# Patient Record
Sex: Male | Born: 1985 | Race: White | Hispanic: No | Marital: Married | Smoking: Former smoker
Health system: Southern US, Community
[De-identification: ages and names within clinical notes are randomized; demographics above are authoritative.]

## PROBLEM LIST (undated history)

## (undated) DIAGNOSIS — I456 Pre-excitation syndrome: Secondary | ICD-10-CM

## (undated) DIAGNOSIS — Z720 Tobacco use: Secondary | ICD-10-CM

## (undated) DIAGNOSIS — Z8679 Personal history of other diseases of the circulatory system: Secondary | ICD-10-CM

## (undated) DIAGNOSIS — Z9889 Other specified postprocedural states: Secondary | ICD-10-CM

## (undated) DIAGNOSIS — G4733 Obstructive sleep apnea (adult) (pediatric): Secondary | ICD-10-CM

## (undated) HISTORY — DX: Tobacco use: Z72.0

## (undated) HISTORY — DX: Obstructive sleep apnea (adult) (pediatric): G47.33

## (undated) HISTORY — DX: Pre-excitation syndrome: I45.6

---

## 2016-07-18 ENCOUNTER — Encounter (HOSPITAL_COMMUNITY): Payer: Self-pay | Admitting: Emergency Medicine

## 2016-07-18 ENCOUNTER — Emergency Department (HOSPITAL_COMMUNITY)
Admission: EM | Admit: 2016-07-18 | Discharge: 2016-07-18 | Disposition: A | Discharge status: Home / Self Care | Payer: 59 | Attending: Emergency Medicine | Admitting: Emergency Medicine

## 2016-07-18 DIAGNOSIS — K029 Dental caries, unspecified: Secondary | ICD-10-CM | POA: Insufficient documentation

## 2016-07-18 DIAGNOSIS — F17210 Nicotine dependence, cigarettes, uncomplicated: Secondary | ICD-10-CM | POA: Insufficient documentation

## 2016-07-18 DIAGNOSIS — Z79899 Other long term (current) drug therapy: Secondary | ICD-10-CM | POA: Insufficient documentation

## 2016-07-18 DIAGNOSIS — K0889 Other specified disorders of teeth and supporting structures: Secondary | ICD-10-CM | POA: Diagnosis present

## 2016-07-18 MED ORDER — PENICILLIN V POTASSIUM 500 MG PO TABS: 500.0000 mg | ORAL_TABLET | Freq: Three times a day (TID) | ORAL | 0 refills | Status: AC

## 2016-07-18 MED ORDER — IBUPROFEN 400 MG PO TABS: 400.0000 mg | ORAL_TABLET | Freq: Three times a day (TID) | ORAL | 0 refills | Status: DC | PRN

## 2016-07-18 NOTE — ED Provider Notes (Signed)
[facility]-EMERGENCY DEPT Provider Note   CSN: [id number] Arrival date & time: 07/18/16  0524     History   Chief Complaint Chief Complaint  Patient presents with  . Dental Pain    HPI Spencer FowlerJimmy Dean Dalton is a 31 y.o. male.  HPI Patient presents emergency department complaining of one week of left upper dental pain without facial swelling.  No fevers or chills.  No difficulty breathing or swallowing.  No other complaints at this time.  He is scheduled to see dentistry next week.  He is requesting a prescription for antibiotics is concerned he has infection.  Symptoms are mild to moderate in severity   History reviewed. No pertinent past medical history.  There are no active problems to display for this patient.   History reviewed. No pertinent surgical history.     Home Medications    Prior to Admission medications   Medication Sig Start Date End Date Taking? Authorizing Provider  ibuprofen (ADVIL,MOTRIN) 400 MG tablet Take 1 tablet (400 mg total) by mouth every 8 (eight) hours as needed. 07/18/16   Azalia Bilisampos, Meisha Salone, MD  penicillin v potassium (VEETID) 500 MG tablet Take 1 tablet (500 mg total) by mouth 3 (three) times daily. 07/18/16 07/25/16  Azalia Bilisampos, Willye Javier, MD    Family History History reviewed. No pertinent family history.  Social History Social History  Substance Use Topics  . Smoking status: Current Every Day Smoker    Types: Cigarettes  . Smokeless tobacco: Never Used  . Alcohol use No     Allergies   Patient has no known allergies.   Review of Systems Review of Systems  All other systems reviewed and are negative.    Physical Exam Updated Vital Signs BP (!) 141/85 (BP Location: Left Arm)   Pulse 72   Temp 98.5 F (36.9 C) (Oral)   Resp 18   SpO2 100%   Physical Exam  Constitutional: He is oriented to person, place, and time. He appears well-developed and well-nourished.  HENT:  Head: Normocephalic.  Tooth #11 with tenderness.  No gingival swelling  or fluctuance.  Tolerating secretions.  Oral airway patent.  Eyes: EOM are normal.  Neck: Normal range of motion.  Pulmonary/Chest: Effort normal.  Abdominal: He exhibits no distension.  Musculoskeletal: Normal range of motion.  Neurological: He is alert and oriented to person, place, and time.  Psychiatric: He has a normal mood and affect.  Nursing note and vitals reviewed.    ED Treatments / Results  Labs (all labs ordered are listed, but only abnormal results are displayed) Labs Reviewed - No data to display  EKG  EKG Interpretation None       Radiology No results found.  Procedures Procedures (including critical care time)  Medications Ordered in ED Medications - No data to display   Initial Impression / Assessment and Plan / ED Course  I have reviewed the triage vital signs and the nursing notes.  Pertinent labs & imaging results that were available during my care of the patient were reviewed by me and considered in my medical decision making (see chart for details).     Dental Pain. Home with antibiotics and pain medicine. Recommend dental follow up. No signs of gingival abscess. Tolerating secretions. Airway patent. No sub lingular swelling   Final Clinical Impressions(s) / ED Diagnoses   Final diagnoses:  Pain due to dental caries    New Prescriptions New Prescriptions   IBUPROFEN (ADVIL,MOTRIN) 400 MG TABLET    Take 1  tablet (400 mg total) by mouth every 8 (eight) hours as needed.   PENICILLIN V POTASSIUM (VEETID) 500 MG TABLET    Take 1 tablet (500 mg total) by mouth 3 (three) times daily.     Azalia Bilis, MD 07/18/16 [phone]

## 2016-07-18 NOTE — Discharge Instructions (Signed)
Please follow up with the dentist as scheduled.

## 2016-07-18 NOTE — ED Notes (Signed)
[facility] Expected date:  Expected time:  Means of arrival:  Comments: EMS/Back pain

## 2016-07-18 NOTE — ED Triage Notes (Signed)
Pt states he has an abscessed tooth and needs antibiotics  Pt states he has a dentist appt on Monday

## 2016-07-18 NOTE — ED Notes (Signed)
Called out pt's name and pt's friend said he is outside waiting for a bus pass

## 2016-07-18 NOTE — ED Notes (Signed)
[facility]: [facility] Expected date:  Expected time:  Means of arrival:  Comments: 

## 2016-07-18 NOTE — ED Notes (Signed)
Dental pain. 

## 2017-04-05 ENCOUNTER — Encounter: Payer: Self-pay | Admitting: Pulmonary Disease

## 2017-04-05 ENCOUNTER — Ambulatory Visit (INDEPENDENT_AMBULATORY_CARE_PROVIDER_SITE_OTHER): Payer: 59 | Admitting: Pulmonary Disease

## 2017-04-05 ENCOUNTER — Ambulatory Visit (INDEPENDENT_AMBULATORY_CARE_PROVIDER_SITE_OTHER)
Admission: RE | Admit: 2017-04-05 | Discharge: 2017-04-05 | Disposition: A | Discharge status: Home / Self Care | Payer: 59 | Source: Ambulatory Visit | Attending: Pulmonary Disease | Admitting: Pulmonary Disease

## 2017-04-05 ENCOUNTER — Ambulatory Visit: Payer: 59 | Admitting: Pulmonary Disease

## 2017-04-05 VITALS — BP 140/82 | HR 86 | Ht 77.0 in | Wt 282.0 lb

## 2017-04-05 DIAGNOSIS — I456 Pre-excitation syndrome: Secondary | ICD-10-CM

## 2017-04-05 DIAGNOSIS — R0602 Shortness of breath: Secondary | ICD-10-CM | POA: Diagnosis not present

## 2017-04-05 DIAGNOSIS — G4733 Obstructive sleep apnea (adult) (pediatric): Secondary | ICD-10-CM

## 2017-04-05 DIAGNOSIS — Z720 Tobacco use: Secondary | ICD-10-CM | POA: Diagnosis not present

## 2017-04-05 HISTORY — DX: Pre-excitation syndrome: I45.6

## 2017-04-05 HISTORY — DX: Tobacco use: Z72.0

## 2017-04-05 HISTORY — DX: Obstructive sleep apnea (adult) (pediatric): G47.33

## 2017-04-05 LAB — PULMONARY FUNCTION TEST
FEF 25-75 Post: 4 L/sec
FEF 25-75 Pre: 2.18 L/sec
FEF2575-%CHANGE-POST: 83 %
FEF2575-%Pred-Post: 85 %
FEF2575-%Pred-Pre: 46 %
FEV1-%Change-Post: 27 %
FEV1-%Pred-Post: 94 %
FEV1-%Pred-Pre: 73 %
FEV1-Post: 4.63 L
FEV1-Pre: 3.62 L
FEV1FVC-%CHANGE-POST: 17 %
FEV1FVC-%Pred-Pre: 75 %
FEV6-%CHANGE-POST: 10 %
FEV6-%PRED-PRE: 96 %
FEV6-%Pred-Post: 106 %
FEV6-PRE: 5.79 L
FEV6-Post: 6.38 L
FEV6FVC-%Change-Post: 1 %
FEV6FVC-%PRED-PRE: 100 %
FEV6FVC-%Pred-Post: 101 %
FVC-%CHANGE-POST: 9 %
FVC-%Pred-Post: 105 %
FVC-%Pred-Pre: 96 %
FVC-POST: 6.43 L
FVC-Pre: 5.9 L
POST FEV6/FVC RATIO: 99 %
Post FEV1/FVC ratio: 72 %
Pre FEV1/FVC ratio: 61 %
Pre FEV6/FVC Ratio: 98 %

## 2017-04-05 NOTE — Progress Notes (Signed)
Spirometry pre and post done today. 

## 2017-04-05 NOTE — Patient Instructions (Signed)
Home sleep study will be scheduled. We discussed treatment options.  Chest x-ray today. Congratulations on quitting smoking. He can use nicotine patch 21 mg/day or nicotine gum as needed. 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

## 2017-04-05 NOTE — Assessment & Plan Note (Signed)
Chest x-ray today. Congratulations on quitting smoking. He can use nicotine patch 21 mg/day or nicotine gum as needed. -call us back if you need anything else for smoking urges

## 2017-04-05 NOTE — Assessment & Plan Note (Signed)
Given excessive daytime somnolence, narrow pharyngeal exam, witnessed apneas & loud snoring, obstructive sleep apnea is very likely & an overnight polysomnogram will be scheduled as a home study. The pathophysiology of obstructive sleep apnea , it's cardiovascular consequences & modes of treatment including CPAP were discused with the patient in detail & they evidenced understanding.  Pretest probability is high 

## 2017-04-05 NOTE — Assessment & Plan Note (Signed)
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

## 2017-04-05 NOTE — Progress Notes (Signed)
Subjective:    Patient ID: Spencer Dalton, male    DOB: [birth year], 32 y.o.   MRN: [id number]  HPI  32 year old presents for evaluation of sleep disordered breathing and dyspnea. He is accompanied by his wife Marchelle Folks who provides and corroborates sleep history. He reports choking episodes that awoken him up from his sleep.  Wife reports loud snoring and witnessed apneas.  He reports excessive daytime somnolence and fatigue, he is only 32 but feels like an old person. Epworth sleepiness score is 7.  He works the swing shift and has to work nights every fifth week. Bedtime can be around 10 PM, sleep latency is minimal, he has no trouble ever falling asleep, he sleeps on his left side with one pillow because his left nostril is blocked, reports 4-5 nocturnal awakenings including nocturia.  Occasionally he wakes up choking and has to sit up on the side of his bed, wakeup time varies depending on his workday, on weekends he can sleep straight up to 10 AM and sometimes even up to noon. He has lost 30 pounds from his initial weight of 310 pounds to his current weight of 290 There is no history suggestive of cataplexy, sleep paralysis or parasomnias   About 2 years ago around Thanksgiving, he had an ED visit for tachycardia and was diagnosed with WPW syndrome.  He has never seen cardiology for this.  He does not have a PCP.  He smoked about a pack per day until he quit about 2 months ago-about 15 pack years.  He still feels short of breath while on the treadmill and feels like he has a burning sensation in his chest.  He also feels dyspneic while climbing up the tanker.  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 Family history-cousin died at age 28 of heart disease, grandmother had emphysema, was on oxygen and died at age 72, uncle had lung cancer at age 47 and was a never smoker   No past medical history on file.  No past surgical history on file.  No Known  Allergies  Social History   Socioeconomic History  . Marital status: Married    Spouse name: Not on file  . Number of children: Not on file  . Years of education: Not on file  . Highest education level: Not on file  Social Needs  . Financial resource strain: Not on file  . Food insecurity - worry: Not on file  . Food insecurity - inability: Not on file  . Transportation needs - medical: Not on file  . Transportation needs - non-medical: Not on file  Occupational History  . Not on file  Tobacco Use  . Smoking status: Former Smoker    Packs/day: 1.00    Types: Cigarettes    Last attempt to quit: 01/31/2017    Years since quitting: 0.1  . Smokeless tobacco: Never Used  Substance and Sexual Activity  . Alcohol use: No  . Drug use: No  . Sexual activity: Not on file  Other Topics Concern  . Not on file  Social History Narrative  . Not on file      Review of Systems  HENT: Positive for congestion, nosebleeds, postnasal drip, sinus pressure, sinus pain and sore throat. Negative for sneezing.   Respiratory: Positive for apnea, cough, chest tightness, shortness of breath, wheezing and stridor. Negative for choking.   Neurological: Positive for dizziness, weakness, light-headedness and numbness. Negative  for seizures, syncope, speech difficulty and headaches.       Objective:   Physical Exam  Gen. Pleasant, tall obese, in no distress, normal affect ENT - underbite,, no post nasal drip, class 2 airway Neck: No JVD, no thyromegaly, no carotid bruits Lungs: no use of accessory muscles, no dullness to percussion, decreased without rales or rhonchi  Cardiovascular: Rhythm regular, heart sounds  normal, no murmurs or gallops, no peripheral edema Abdomen: soft and non-tender, no hepatosplenomegaly, BS normal. Musculoskeletal: No deformities, no cyanosis or clubbing Neuro:  alert, non focal, no tremors       Assessment & Plan:

## 2017-04-07 ENCOUNTER — Other Ambulatory Visit: Payer: Self-pay

## 2017-04-07 DIAGNOSIS — I456 Pre-excitation syndrome: Secondary | ICD-10-CM

## 2017-04-07 MED ORDER — ALBUTEROL SULFATE HFA 108 (90 BASE) MCG/ACT IN AERS: 2.0000 | INHALATION_SPRAY | Freq: Four times a day (QID) | RESPIRATORY_TRACT | 6 refills | Status: AC | PRN

## 2017-04-14 ENCOUNTER — Emergency Department (HOSPITAL_COMMUNITY): Payer: 59

## 2017-04-14 ENCOUNTER — Other Ambulatory Visit: Payer: Self-pay

## 2017-04-14 ENCOUNTER — Encounter (HOSPITAL_COMMUNITY): Payer: Self-pay | Admitting: Emergency Medicine

## 2017-04-14 DIAGNOSIS — Z5321 Procedure and treatment not carried out due to patient leaving prior to being seen by health care provider: Secondary | ICD-10-CM | POA: Diagnosis not present

## 2017-04-14 DIAGNOSIS — R079 Chest pain, unspecified: Secondary | ICD-10-CM | POA: Insufficient documentation

## 2017-04-14 NOTE — ED Triage Notes (Signed)
Patient is having left chest pain. Patient stated he has had it for a week. Patient complaining of left arm pain while at the gym. He has also had left jaw pain.

## 2017-04-14 NOTE — ED Notes (Signed)
No answer for blood draw @ this time. 

## 2017-04-15 ENCOUNTER — Emergency Department (HOSPITAL_COMMUNITY)
Admission: EM | Admit: 2017-04-15 | Discharge: 2017-04-15 | Disposition: A | Discharge status: Home / Self Care | Payer: 59 | Attending: Emergency Medicine | Admitting: Emergency Medicine

## 2017-04-15 NOTE — ED Notes (Signed)
Called patient to be taken to a bed and no answer.

## 2017-05-13 DIAGNOSIS — G4710 Hypersomnia, unspecified: Secondary | ICD-10-CM | POA: Diagnosis not present

## 2017-05-15 ENCOUNTER — Other Ambulatory Visit: Payer: Self-pay | Admitting: *Deleted

## 2017-05-15 DIAGNOSIS — G4733 Obstructive sleep apnea (adult) (pediatric): Secondary | ICD-10-CM

## 2017-05-15 DIAGNOSIS — G4710 Hypersomnia, unspecified: Secondary | ICD-10-CM | POA: Diagnosis not present

## 2017-05-23 ENCOUNTER — Telehealth: Payer: Self-pay | Admitting: Pulmonary Disease

## 2017-05-23 DIAGNOSIS — G4719 Other hypersomnia: Secondary | ICD-10-CM

## 2017-05-23 DIAGNOSIS — G4733 Obstructive sleep apnea (adult) (pediatric): Secondary | ICD-10-CM

## 2017-05-23 NOTE — Telephone Encounter (Signed)
Either repeat home sleep study or split-night study

## 2017-05-23 NOTE — Telephone Encounter (Signed)
Per RA, surprisingly did not show OSA. Did he sleep well? Consider repeating or doing a split study as the next step.

## 2017-05-23 NOTE — Telephone Encounter (Signed)
Spoke with patient. He stated that he did sleep well during the test. Advised patient that I would relay this message to RA. Patient stated that he does not feel tired all of the time, it just happens on random nights.   RA, please advise if you still want him to split night? Thanks!

## 2017-05-24 NOTE — Telephone Encounter (Signed)
Pt is aware of below message and voiced his understanding. Pt wishes to repeat HST. Order for HST has been ordered. Nothing further is needed.

## 2018-02-13 ENCOUNTER — Encounter: Payer: Self-pay | Admitting: Internal Medicine

## 2018-02-13 ENCOUNTER — Ambulatory Visit: Payer: 59 | Admitting: Internal Medicine

## 2018-02-13 VITALS — BP 136/82 | HR 90 | Ht 77.0 in | Wt 311.0 lb

## 2018-02-13 DIAGNOSIS — R0789 Other chest pain: Secondary | ICD-10-CM | POA: Insufficient documentation

## 2018-02-13 DIAGNOSIS — I456 Pre-excitation syndrome: Secondary | ICD-10-CM

## 2018-02-13 MED ORDER — METOPROLOL TARTRATE 25 MG PO TABS: 12.5000 mg | ORAL_TABLET | Freq: Two times a day (BID) | ORAL | 3 refills | Status: DC | PRN

## 2018-02-13 NOTE — Patient Instructions (Signed)
Medication Instructions:  Dr. Rennis Golden has prescribed metoprolol tartrate 12.5mg  to use twice daily AS NEEDED for tachycardia (fast heart rhythm) If you need a refill on your cardiac medications before your next appointment, please call your pharmacy.   Testing/Procedures: Your physician has requested that you have an exercise tolerance test. This is done @ Dr. Blanchie Dessert office - every day except Monday  Follow-Up: At [facility], you and your health needs are our priority.  As part of our continuing mission to provide you with exceptional heart care, we have created designated Provider Care Teams.  These Care Teams include your primary Cardiologist (physician) and Advanced Practice Providers (APPs -  Physician Assistants and Nurse Practitioners) who all work together to provide you with the care you need, when you need it. You will need a follow up appointment in 3 months. You may see Dr. Rennis Golden or one of the following Advanced Practice Providers on your designated Care Team: Azalee Course, [place] . Micah Flesher, PA-C  Any Other Special Instructions Will Be Listed Below (If Applicable).  You have been referred to Dr. Loman Brooklyn (cardiac electrophysiologist) @ [address]rd [address]

## 2018-02-13 NOTE — Progress Notes (Signed)
OFFICE NOTE  Chief Complaint:  Establish cardiologist  Primary Care Physician: Patient, No Pcp Per  HPI:  Spencer Dalton is a 33 y.o. male with a past medial history significant for palpitations and atypical chest discomfort, recently seen in the ER and diagnosed with WPW.  He also has a history obstructive sleep apnea, moderate obesity and tobacco abuse.  Family history significant for hypertension in his father and some remote heart disease in his grandparents.  He presents today for evaluation of symptomatic palpitations and chest discomfort.  He describes intermittent left-sided chest discomfort which can be present at rest or associated with exertion.  It is described as a squeezing and oftentimes radiates down the left arm.  Intensity is somewhat diminished at about 2-3 out of 10.  Sometimes he feels like it is associated with palpitations or heart racing.  His EKG was personally reviewed and shows sinus rhythm with sinus arrhythmia at 90 and clear delta waves suggestive of WPW.  He is not on treatment for this.  He denies any history of A. fib or other known arrhythmias.  When he was seen in the ER they did not capture any tachyarrhythmias.  PMHx:  Past Medical History:  Diagnosis Date  . OSA (obstructive sleep apnea) 04/05/2017  . Tobacco abuse 04/05/2017  . WPW (Wolff-Parkinson-White syndrome) 04/05/2017    History reviewed. No pertinent surgical history.  FAMHx:  Family History  Problem Relation Age of Onset  . Diabetes Mother   . Hypertension Father   . Heart attack Maternal Grandmother   . Stroke Maternal Grandfather     SOCHx:   reports that he quit smoking about a year ago. His smoking use included cigarettes. He smoked 1.00 pack per day. He has never used smokeless tobacco. He reports that he does not drink alcohol or use drugs.  ALLERGIES:  No Known Allergies  ROS: Pertinent items noted in HPI and remainder of comprehensive ROS otherwise negative.  HOME  MEDS: Current Outpatient Medications on File Prior to Visit  Medication Sig Dispense Refill  . albuterol (PROVENTIL HFA;VENTOLIN HFA) 108 (90 Base) MCG/ACT inhaler Inhale 2 puffs into the lungs every 6 (six) hours as needed for wheezing or shortness of breath. 1 Inhaler 6  . ibuprofen (ADVIL,MOTRIN) 400 MG tablet Take 1 tablet (400 mg total) by mouth every 8 (eight) hours as needed. 15 tablet 0   No current facility-administered medications on file prior to visit.     LABS/IMAGING: No results found for this or any previous visit (from the past 48 hour(s)). No results found.  LIPID PANEL: No results found for: CHOL, TRIG, HDL, CHOLHDL, VLDL, LDLCALC, LDLDIRECT   WEIGHTS: Wt Readings from Last 3 Encounters:  02/13/18 (!) 311 lb (141.1 kg)  04/14/17 282 lb (127.9 kg)  04/05/17 282 lb (127.9 kg)    VITALS: BP 136/82 (BP Location: Left Arm, Patient Position: Sitting, Cuff Size: Large)   Pulse 90   Ht 6\' 5"  (1.956 m)   Wt (!) 311 lb (141.1 kg)   BMI 36.88 kg/m   EXAM: General appearance: alert and no distress Neck: no carotid bruit, no JVD and thyroid not enlarged, symmetric, no tenderness/mass/nodules Lungs: clear to auscultation bilaterally Heart: regular rate and rhythm, S1, S2 normal, no murmur, click, rub or gallop Abdomen: soft, non-tender; bowel sounds normal; no masses,  no organomegaly Extremities: extremities normal, atraumatic, no cyanosis or edema Pulses: 2+ and symmetric Skin: Skin color, texture, turgor normal. No rashes or lesions Neurologic: Grossly  normal Psych: Pleasant  EKG: Sinus rhythm with sinus arrhythmia, WPW pattern at 90- personally reviewed  ASSESSMENT: 1. WPW 2. Left-sided chest discomfort 3. Symptomatic palpitations  PLAN: 1.   Spencer Dalton is describing symptomatic palpitations and left-sided chest discomfort which is atypical for angina, nevertheless I would like him to undergo plain old exercise treadmill stress testing.  Additionally, with  probable symptomatic palpitations, we discussed options for management of WPW including prophylactic beta-blocker, antiarrhythmic therapy or perhaps a referral for catheter ablation.  Given his young age and desire not to take medications and the fact that he could potentially have significant or life-threatening arrhythmias if his WPW were mismanaged, I would recommend referral for evaluation of catheter ablation.  He is in agreement with this.  We will provide a low-dose beta-blocker to use as needed for recurrent palpitations.  Plan follow-up with me in 3 to 6 months.  Chrystie Nose C. , MD, [facility] Healthcare SystemFACC, FACP  [place]  [facility]  Medical Director of the [facility]id [facility]  Cardiovascular Risk Reduction [facility] Diplomate of the American Board of Clinical Lipidology Attending Cardiologist  Direct Dial: [phone]  Fax: [phone]  Website:  [email or web address]    C  02/13/2018, 5:42 PM

## 2018-02-15 ENCOUNTER — Telehealth (HOSPITAL_COMMUNITY): Payer: Self-pay

## 2018-02-15 NOTE — Telephone Encounter (Signed)
Encounter complete. 

## 2018-02-16 ENCOUNTER — Telehealth (HOSPITAL_COMMUNITY): Payer: Self-pay

## 2018-02-16 NOTE — Telephone Encounter (Signed)
Encounter complete. 

## 2018-02-20 ENCOUNTER — Ambulatory Visit (HOSPITAL_COMMUNITY)
Admission: RE | Admit: 2018-02-20 | Payer: 59 | Source: Ambulatory Visit | Attending: Internal Medicine | Admitting: Internal Medicine

## 2018-02-23 ENCOUNTER — Encounter: Payer: Self-pay | Admitting: Cardiology

## 2018-02-23 ENCOUNTER — Ambulatory Visit: Payer: 59 | Admitting: Cardiology

## 2018-02-23 VITALS — BP 134/72 | HR 85 | Ht 77.0 in | Wt 307.0 lb

## 2018-02-23 DIAGNOSIS — I456 Pre-excitation syndrome: Secondary | ICD-10-CM | POA: Diagnosis not present

## 2018-02-23 NOTE — Patient Instructions (Addendum)
Medication Instructions:  Your physician recommends that you continue on your current medications as directed. Please refer to the Current Medication list given to you today.  * If you need a refill on your cardiac medications before your next appointment, please call your pharmacy.   Labwork: None ordered  Testing/Procedures: Your physician has requested that you have an exercise tolerance test - before your ablation on 03/30/18. For further information please visit [email or web address] Please also follow instruction sheet, as given.  Your physician has recommended that you have an ablation. Catheter ablation is a medical procedure used to treat some cardiac arrhythmias (irregular heartbeats). During catheter ablation, a long, thin, flexible tube is put into a blood vessel in your groin (upper thigh), or neck. This tube is called an ablation catheter. It is then guided to your heart through the blood vessel. Radio frequency waves destroy small areas of heart tissue where abnormal heartbeats may cause an arrhythmia to start. Please see the instruction sheet given to you today.  Instructions for your ablation: 1. [facility] at 7:30 am on 03/30/2018. 2. Do not eat or drink after midnight the night prior to the procedure. 4. Do not take any medications the morning of the procedure. 5. Both of your groins will need to be shaved for this procedure (if needed). We ask that you do this yourself at home 1-2 days prior to the procedure.  If you are unable/uncomfortable to do yourself, the hospital staff will shave you the day of your procedure (if needed). 6. Plan for an overnight stay in the hospital. 7. You will need someone to drive you home at discharge.   Follow-Up: Your physician recommends that you schedule a follow-up appointment in: 4 weeks, after your procedure on 03/30/18, with Dr. Elberta Fortisamnitz.  *Please note that any paperwork  needing to be filled out by the provider will need to be addressed at the front desk prior to seeing the provider. Please note that any FMLA, disability or other documents regarding health condition is subject to a $25.00 charge that must be received prior to completion of paperwork in the form of a money order or check.  Thank you for choosing [facility]!!   Dory HornSherri Lashon Hillier, RN [phone]  Any Other Special Instructions Will Be Listed Below (If Applicable).  CPT code for ablation -- [phone]   Cardiac Ablation Cardiac ablation is a procedure to disable (ablate) a small amount of heart tissue in very specific places. The heart has many electrical connections. Sometimes these connections are abnormal and can cause the heart to beat very fast or irregularly. Ablating some of the problem areas can improve the heart rhythm or return it to normal. Ablation may be done for people who:  Have Wolff-Parkinson-White syndrome.  Have fast heart rhythms (tachycardia).  Have taken medicines for an abnormal heart rhythm (arrhythmia) that were not effective or caused side effects.  Have a high-risk heartbeat that may be life-threatening. During the procedure, a small incision is made in the neck or the groin, and a long, thin, flexible tube (catheter) is inserted into the incision and moved to the heart. Small devices (electrodes) on the tip of the catheter will send out electrical currents. A type of X-ray (fluoroscopy) will be used to help guide the catheter and to provide images of the heart. Tell a health care provider about:  Any allergies you have.  All medicines you are taking, including vitamins,  herbs, eye drops, creams, and over-the-counter medicines.  Any problems you or family members have had with anesthetic medicines.  Any blood disorders you have.  Any surgeries you have had.  Any medical conditions you have, such as kidney failure.  Whether you are pregnant or may be  pregnant. What are the risks? Generally, this is a safe procedure. However, problems may occur, including:  Infection.  Bruising and bleeding at the catheter insertion site.  Bleeding into the chest, especially into the sac that surrounds the heart. This is a serious complication.  Stroke or blood clots.  Damage to other structures or organs.  Allergic reaction to medicines or dyes.  Need for a permanent pacemaker if the normal electrical system is damaged. A pacemaker is a small computer that sends electrical signals to the heart and helps your heart beat normally.  The procedure not being fully effective. This may not be recognized until months later. Repeat ablation procedures are sometimes required. What happens before the procedure?  Follow instructions from your health care provider about eating or drinking restrictions.  Ask your health care provider about: ? Changing or stopping your regular medicines. This is especially important if you are taking diabetes medicines or blood thinners. ? Taking medicines such as aspirin and ibuprofen. These medicines can thin your blood. Do not take these medicines before your procedure if your health care provider instructs you not to.  Plan to have someone take you home from the hospital or clinic.  If you will be going home right after the procedure, plan to have someone with you for 24 hours. What happens during the procedure?  To lower your risk of infection: ? Your health care team will wash or sanitize their hands. ? Your skin will be washed with soap. ? Hair may be removed from the incision area.  An IV tube will be inserted into one of your veins.  You will be given a medicine to help you relax (sedative).  The skin on your neck or groin will be numbed.  An incision will be made in your neck or your groin.  A needle will be inserted through the incision and into a large vein in your neck or groin.  A catheter will be  inserted into the needle and moved to your heart.  Dye may be injected through the catheter to help your surgeon see the area of the heart that needs treatment.  Electrical currents will be sent from the catheter to ablate heart tissue in desired areas. There are three types of energy that may be used to ablate heart tissue: ? Heat (radiofrequency energy). ? Laser energy. ? Extreme cold (cryoablation).  When the necessary tissue has been ablated, the catheter will be removed.  Pressure will be held on the catheter insertion area to prevent excessive bleeding.  A bandage (dressing) will be placed over the catheter insertion area. The procedure may vary among health care providers and hospitals. What happens after the procedure?  Your blood pressure, heart rate, breathing rate, and blood oxygen level will be monitored until the medicines you were given have worn off.  Your catheter insertion area will be monitored for bleeding. You will need to lie still for a few hours to ensure that you do not bleed from the catheter insertion area.  Do not drive for 24 hours or as long as directed by your health care provider. Summary  Cardiac ablation is a procedure to disable (ablate) a small amount of  heart tissue in very specific places. Ablating some of the problem areas can improve the heart rhythm or return it to normal.  During the procedure, electrical currents will be sent from the catheter to ablate heart tissue in desired areas. This information is not intended to replace advice given to you by your health care provider. Make sure you discuss any questions you have with your health care provider. Document Released: 06/05/2008 Document Revised: 12/07/2015 Document Reviewed: 12/07/2015 Elsevier Interactive Patient Education  2019 [employer]

## 2018-02-23 NOTE — Progress Notes (Signed)
Electrophysiology Office Note   Date:  02/23/2018   ID:  Spencer Dalton, Spencer Dalton 08/23/1985, MRN [id number]  PCP:  Patient, No Pcp Per  Cardiologist:  Hilty Primary Electrophysiologist:  Lasonia Casino Jorja Loa, MD    No chief complaint on file.    History of Present Illness: Spencer Dalton is a 33 y.o. male who is being seen today for the evaluation of WPW at the request of Hilty, Lisette Abu, MD. Presenting today for electrophysiology evaluation.  He has a history of palpitations and atypical chest pain.  He was recently seen in the emergency room and diagnosed with WPW.  He also has obstructive sleep apnea, moderate obesity, and tobacco abuse.  He has been having intermittent left-sided chest discomfort associated with rest and with exertion.  He also feels that he has palpitations.  His EKG shows sinus rhythm with delta waves suggestive of WPW.  He has not been on any therapy for this.  He denies any other history of atrial fibrillation or other known arrhythmias.    Today, he denies symptoms of shortness of breath, orthopnea, PND, lower extremity edema, claudication, dizziness, presyncope, syncope, bleeding, or neurologic sequela. The patient is tolerating medications without difficulties.  He was put on metoprolol as needed for his palpitations.  He has continued to have episodic palpitations despite his metoprolol use.  He also continues to have chest pain, though with further questioning, he feels that it may be due to musculoskeletal causes.   Past Medical History:  Diagnosis Date  . OSA (obstructive sleep apnea) 04/05/2017  . Tobacco abuse 04/05/2017  . WPW (Wolff-Parkinson-White syndrome) 04/05/2017   History reviewed. No pertinent surgical history.   Current Outpatient Medications  Medication Sig Dispense Refill  . albuterol (PROVENTIL HFA;VENTOLIN HFA) 108 (90 Base) MCG/ACT inhaler Inhale 2 puffs into the lungs every 6 (six) hours as needed for wheezing or shortness of breath. 1  Inhaler 6  . ibuprofen (ADVIL,MOTRIN) 400 MG tablet Take 1 tablet (400 mg total) by mouth every 8 (eight) hours as needed. 15 tablet 0  . metoprolol tartrate (LOPRESSOR) 25 MG tablet Take 0.5 tablets (12.5 mg total) by mouth 2 (two) times daily as needed (tachycardia). 60 tablet 3   No current facility-administered medications for this visit.     Allergies:   Patient has no known allergies.   Social History:  The patient  reports that he quit smoking about 12 months ago. His smoking use included cigarettes. He smoked 1.00 pack per day. He has never used smokeless tobacco. He reports that he does not drink alcohol or use drugs.   Family History:  The patient's family history includes Diabetes in his mother; Heart attack in his maternal grandmother; Hypertension in his father; Stroke in his maternal grandfather.    ROS:  Please see the history of present illness.   Otherwise, review of systems is positive for fatigue, chest pain, shortness of breath, palpitations, anxiety.   All other systems are reviewed and negative.    PHYSICAL EXAM: VS:  BP 134/72   Pulse 85   Ht 6\' 5"  (1.956 m)   Wt (!) 307 lb (139.3 kg)   BMI 36.40 kg/m  , BMI Body mass index is 36.4 kg/m. GEN: Well nourished, well developed, in no acute distress  HEENT: normal  Neck: no JVD, carotid bruits, or masses Cardiac: RRR; no murmurs, rubs, or gallops,no edema  Respiratory:  clear to auscultation bilaterally, normal work of breathing GI: soft, nontender, nondistended, +  BS MS: no deformity or atrophy  Skin: warm and dry Neuro:  Strength and sensation are intact Psych: euthymic mood, full affect  EKG:  EKG is ordered today. Personal review of the ekg ordered shows sinus rhythm, rate 85, PACs, ventricular preexcitation  Recent Labs: No results found for requested labs within last 8760 hours.    Lipid Panel  No results found for: CHOL, TRIG, HDL, CHOLHDL, VLDL, LDLCALC, LDLDIRECT   Wt Readings from Last 3  Encounters:  02/23/18 (!) 307 lb (139.3 kg)  02/13/18 (!) 311 lb (141.1 kg)  04/14/17 282 lb (127.9 kg)      Other studies Reviewed: Additional studies/ records that were reviewed today include: Epic notes   ASSESSMENT AND PLAN:  1.  WPW: Currently symptomatic with palpitations.  Is certainly possible that he is having tachycardia related to his accessory pathway.  Due to that, we Cleburn Maiolo plan for ablation.  Risks and benefits were discussed and include bleeding, tamponade, heart block, stroke.  The patient understand these risks and is agreed to the procedure.  2.  Chest pain: Patient is been ordered for an exercise treadmill test.  We Masato Pettie make sure that it is scheduled today.    Current medicines are reviewed at length with the patient today.   The patient does not have concerns regarding his medicines.  The following changes were made today:  none  Labs/ tests ordered today include:  Orders Placed This Encounter  Procedures  . EKG 12-Lead   Case discussed with primary cardiology  Disposition:   FU with Samie Barclift 2 months  Signed, Glorianna Gott Jorja Loa, MD  02/23/2018 11:33 AM     [facility] [address] [place] [address] [phone] (office) [phone] (fax)

## 2018-03-13 ENCOUNTER — Telehealth: Payer: Self-pay | Admitting: *Deleted

## 2018-03-13 DIAGNOSIS — I456 Pre-excitation syndrome: Secondary | ICD-10-CM

## 2018-03-13 DIAGNOSIS — Z01812 Encounter for preprocedural laboratory examination: Secondary | ICD-10-CM

## 2018-03-13 NOTE — Telephone Encounter (Signed)
Informed pt of scheduling change at the hospital. Pt aware to arrive at 10:30 am on 2/28 for his procedure. Patient verbalized understanding and agreeable to plan.

## 2018-03-15 NOTE — Telephone Encounter (Signed)
Pt will have lab work on 2/18 when he comes for GXT.  He also agreed to schedule his 4 week post ablation f/u w/ Camnitz before he leaves office on 2/18.

## 2018-03-15 NOTE — Addendum Note (Signed)
Addended by: Baird Lyons on: 03/15/2018 03:47 PM   Modules accepted: Orders

## 2018-03-20 ENCOUNTER — Ambulatory Visit (INDEPENDENT_AMBULATORY_CARE_PROVIDER_SITE_OTHER): Payer: 59

## 2018-03-20 ENCOUNTER — Other Ambulatory Visit: Payer: 59

## 2018-03-20 DIAGNOSIS — Z01812 Encounter for preprocedural laboratory examination: Secondary | ICD-10-CM

## 2018-03-20 DIAGNOSIS — I456 Pre-excitation syndrome: Secondary | ICD-10-CM

## 2018-03-20 DIAGNOSIS — R0789 Other chest pain: Secondary | ICD-10-CM | POA: Diagnosis not present

## 2018-03-21 LAB — BASIC METABOLIC PANEL
BUN/Creatinine Ratio: 15 (ref 9–20)
BUN: 15 mg/dL (ref 6–20)
CO2: 26 mmol/L (ref 20–29)
Calcium: 9.4 mg/dL (ref 8.7–10.2)
Chloride: 98 mmol/L (ref 96–106)
Creatinine, Ser: 1.02 mg/dL (ref 0.76–1.27)
GFR calc Af Amer: 110 mL/min/{1.73_m2} (ref >59)
GFR calc non Af Amer: 95 mL/min/{1.73_m2} (ref >59)
Glucose: 79 mg/dL (ref 65–99)
Potassium: 5 mmol/L (ref 3.5–5.2)
Sodium: 137 mmol/L (ref 134–144)

## 2018-03-21 LAB — EXERCISE TOLERANCE TEST
CSEPPHR: 166 {beats}/min
Estimated workload: 11.9 METS
Exercise duration (min): 9 min
Exercise duration (sec): 30 s
MPHR: 186 {beats}/min
Percent HR: 89 %
Rest HR: 62 {beats}/min

## 2018-03-21 LAB — CBC
HEMOGLOBIN: 17.2 g/dL (ref 13.0–17.7)
Hematocrit: 51.1 % — ABNORMAL HIGH (ref 37.5–51.0)
MCH: 30.8 pg (ref 26.6–33.0)
MCHC: 33.7 g/dL (ref 31.5–35.7)
MCV: 92 fL (ref 79–97)
Platelets: 194 10*3/uL (ref 150–450)
RBC: 5.58 x10E6/uL (ref 4.14–5.80)
RDW: 12 % (ref 11.6–15.4)
WBC: 6.9 10*3/uL (ref 3.4–10.8)

## 2018-03-30 ENCOUNTER — Ambulatory Visit (HOSPITAL_COMMUNITY)
Admission: RE | Admit: 2018-03-30 | Discharge: 2018-03-31 | Disposition: A | Discharge status: Home / Self Care | Payer: 59 | Attending: Cardiology | Admitting: Cardiology

## 2018-03-30 ENCOUNTER — Other Ambulatory Visit: Payer: Self-pay

## 2018-03-30 ENCOUNTER — Encounter (HOSPITAL_COMMUNITY)
Admission: RE | Disposition: A | Discharge status: Home / Self Care | Payer: Self-pay | Source: Home / Self Care | Attending: Cardiology

## 2018-03-30 DIAGNOSIS — I471 Supraventricular tachycardia: Secondary | ICD-10-CM | POA: Diagnosis not present

## 2018-03-30 DIAGNOSIS — R002 Palpitations: Secondary | ICD-10-CM | POA: Insufficient documentation

## 2018-03-30 DIAGNOSIS — G4733 Obstructive sleep apnea (adult) (pediatric): Secondary | ICD-10-CM | POA: Insufficient documentation

## 2018-03-30 DIAGNOSIS — Z823 Family history of stroke: Secondary | ICD-10-CM | POA: Diagnosis not present

## 2018-03-30 DIAGNOSIS — R0789 Other chest pain: Secondary | ICD-10-CM | POA: Insufficient documentation

## 2018-03-30 DIAGNOSIS — Z6841 Body Mass Index (BMI) 40.0 and over, adult: Secondary | ICD-10-CM | POA: Diagnosis not present

## 2018-03-30 DIAGNOSIS — I456 Pre-excitation syndrome: Secondary | ICD-10-CM | POA: Diagnosis not present

## 2018-03-30 DIAGNOSIS — Z87891 Personal history of nicotine dependence: Secondary | ICD-10-CM | POA: Diagnosis not present

## 2018-03-30 DIAGNOSIS — Z8249 Family history of ischemic heart disease and other diseases of the circulatory system: Secondary | ICD-10-CM | POA: Insufficient documentation

## 2018-03-30 DIAGNOSIS — Z8679 Personal history of other diseases of the circulatory system: Secondary | ICD-10-CM

## 2018-03-30 DIAGNOSIS — Z9889 Other specified postprocedural states: Secondary | ICD-10-CM

## 2018-03-30 HISTORY — PX: SVT ABLATION: EP1225

## 2018-03-30 HISTORY — DX: Personal history of other diseases of the circulatory system: Z86.79

## 2018-03-30 HISTORY — DX: Other specified postprocedural states: Z98.890

## 2018-03-30 LAB — POCT ACTIVATED CLOTTING TIME
ACTIVATED CLOTTING TIME: 180 s
Activated Clotting Time: 224 seconds

## 2018-03-30 SURGERY — SVT ABLATION

## 2018-03-30 MED ORDER — HYDROMORPHONE HCL 1 MG/ML IJ SOLN
INTRAMUSCULAR | Status: DC | PRN
  Administered 2018-03-30 (×2): 0.5 mg via INTRAVENOUS

## 2018-03-30 MED ORDER — METOPROLOL TARTRATE 12.5 MG HALF TABLET: 12.5000 mg | ORAL_TABLET | Freq: Two times a day (BID) | ORAL | Status: DC | PRN

## 2018-03-30 MED ORDER — SODIUM CHLORIDE 0.9% FLUSH
3.0000 mL | Freq: Two times a day (BID) | INTRAVENOUS | Status: DC
  Administered 2018-03-30: 3 mL via INTRAVENOUS

## 2018-03-30 MED ORDER — FENTANYL CITRATE (PF) 100 MCG/2ML IJ SOLN
INTRAMUSCULAR | Status: AC
  Filled 2018-03-30: qty 2

## 2018-03-30 MED ORDER — MIDAZOLAM HCL 5 MG/5ML IJ SOLN
INTRAMUSCULAR | Status: AC
  Filled 2018-03-30: qty 5

## 2018-03-30 MED ORDER — SODIUM CHLORIDE 0.9 % IV SOLN: 250.0000 mL | INTRAVENOUS | Status: DC | PRN

## 2018-03-30 MED ORDER — HEPARIN (PORCINE) IN NACL 1000-0.9 UT/500ML-% IV SOLN
INTRAVENOUS | Status: DC | PRN
  Administered 2018-03-30: 500 mL

## 2018-03-30 MED ORDER — HEPARIN SODIUM (PORCINE) 1000 UNIT/ML IJ SOLN
INTRAMUSCULAR | Status: DC | PRN
  Administered 2018-03-30: 10000 [IU] via INTRAVENOUS

## 2018-03-30 MED ORDER — HYDROMORPHONE HCL 1 MG/ML IJ SOLN
INTRAMUSCULAR | Status: AC
  Filled 2018-03-30: qty 0.5

## 2018-03-30 MED ORDER — FENTANYL CITRATE (PF) 100 MCG/2ML IJ SOLN
25.0000 ug | Freq: Once | INTRAMUSCULAR | Status: AC
  Administered 2018-03-30: 25 ug via INTRAVENOUS
  Filled 2018-03-30: qty 2

## 2018-03-30 MED ORDER — FENTANYL CITRATE (PF) 100 MCG/2ML IJ SOLN
INTRAMUSCULAR | Status: DC | PRN
  Administered 2018-03-30 (×17): 25 ug via INTRAVENOUS
  Administered 2018-03-30: 50 ug via INTRAVENOUS
  Administered 2018-03-30: 25 ug via INTRAVENOUS

## 2018-03-30 MED ORDER — MIDAZOLAM HCL 5 MG/5ML IJ SOLN
INTRAMUSCULAR | Status: DC | PRN
  Administered 2018-03-30 (×4): 1 mg via INTRAVENOUS
  Administered 2018-03-30: 2 mg via INTRAVENOUS
  Administered 2018-03-30 (×2): 1 mg via INTRAVENOUS
  Administered 2018-03-30: 2 mg via INTRAVENOUS
  Administered 2018-03-30 (×10): 1 mg via INTRAVENOUS

## 2018-03-30 MED ORDER — ACETAMINOPHEN 325 MG PO TABS: 650.0000 mg | ORAL_TABLET | ORAL | Status: DC | PRN

## 2018-03-30 MED ORDER — FENTANYL CITRATE (PF) 100 MCG/2ML IJ SOLN
25.0000 ug | Freq: Once | INTRAMUSCULAR | Status: AC
  Administered 2018-03-30: 25 ug via INTRAVENOUS

## 2018-03-30 MED ORDER — ONDANSETRON HCL 4 MG/2ML IJ SOLN: 4.0000 mg | Freq: Four times a day (QID) | INTRAMUSCULAR | Status: DC | PRN

## 2018-03-30 MED ORDER — BUPIVACAINE HCL (PF) 0.25 % IJ SOLN
INTRAMUSCULAR | Status: AC
  Filled 2018-03-30: qty 60

## 2018-03-30 MED ORDER — SODIUM CHLORIDE 0.9 % IV SOLN
INTRAVENOUS | Status: DC
  Administered 2018-03-30: 11:00:00 via INTRAVENOUS

## 2018-03-30 MED ORDER — IBUPROFEN 800 MG PO TABS
800.0000 mg | ORAL_TABLET | Freq: Three times a day (TID) | ORAL | Status: DC | PRN
  Administered 2018-03-30: 800 mg via ORAL
  Filled 2018-03-30 (×2): qty 1

## 2018-03-30 MED ORDER — BUPIVACAINE HCL (PF) 0.25 % IJ SOLN
INTRAMUSCULAR | Status: AC
  Filled 2018-03-30: qty 30

## 2018-03-30 MED ORDER — HEPARIN SODIUM (PORCINE) 1000 UNIT/ML IJ SOLN
INTRAMUSCULAR | Status: AC
  Filled 2018-03-30: qty 1

## 2018-03-30 MED ORDER — HEPARIN (PORCINE) IN NACL 1000-0.9 UT/500ML-% IV SOLN
INTRAVENOUS | Status: AC
  Filled 2018-03-30: qty 500

## 2018-03-30 MED ORDER — BUPIVACAINE HCL (PF) 0.25 % IJ SOLN
INTRAMUSCULAR | Status: DC | PRN
  Administered 2018-03-30: 45 mL

## 2018-03-30 MED ORDER — SODIUM CHLORIDE 0.9% FLUSH: 3.0000 mL | INTRAVENOUS | Status: DC | PRN

## 2018-03-30 MED ORDER — ALBUTEROL SULFATE (2.5 MG/3ML) 0.083% IN NEBU: 2.5000 mg | INHALATION_SOLUTION | Freq: Four times a day (QID) | RESPIRATORY_TRACT | Status: DC | PRN

## 2018-03-30 SURGICAL SUPPLY — 17 items
CATH EZ STEER NAV 4MM D-F CUR (ABLATOR) ×2 IMPLANT
CATH JOSEPH QUAD ALLRED 6F REP (CATHETERS) ×4 IMPLANT
CATH SOUNDSTAR ECO REPROCESSED (CATHETERS) ×2 IMPLANT
CATH WEBSTER BI DIR CS D-F CRV (CATHETERS) ×2 IMPLANT
PACK EP LATEX FREE (CUSTOM PROCEDURE TRAY) ×1
PACK EP LF (CUSTOM PROCEDURE TRAY) ×1 IMPLANT
PAD PRO RADIOLUCENT 2001M-C (PAD) ×2 IMPLANT
PATCH CARTO3 (PAD) ×2 IMPLANT
SHEATH AVANTI 11F 11CM (SHEATH) ×2 IMPLANT
SHEATH BAYLIS SUREFLEX  M 8.5 (SHEATH) ×1
SHEATH BAYLIS SUREFLEX M 8.5 (SHEATH) ×1 IMPLANT
SHEATH BAYLIS TRANSSEPTAL 98CM (NEEDLE) ×2 IMPLANT
SHEATH PINNACLE 6F 10CM (SHEATH) ×4 IMPLANT
SHEATH PINNACLE 7F 10CM (SHEATH) ×2 IMPLANT
SHEATH PINNACLE 8F 10CM (SHEATH) ×2 IMPLANT
SHEATH PINNACLE 9F 10CM (SHEATH) ×2 IMPLANT
SHEATH PROBE COVER 6X72 (BAG) ×2 IMPLANT

## 2018-03-30 NOTE — H&P (Addendum)
Spencer Dalton has presented today for surgery, with the diagnosis of SVT.  The various methods of treatment have been discussed with the patient and family. After consideration of risks, benefits and other options for treatment, the patient has consented to  Procedure(s): Catheter ablation as a surgical intervention .  Risks include but not limited to bleeding, tamponade, heart block, stroke, damage to surrounding organs, among others. The patient's history has been reviewed, patient examined, no change in status, stable for surgery.  I have reviewed the patient's chart and labs.  Questions were answered to the patient's satisfaction.    Loman Brooklyn, MD 03/30/2018 10:40 AM     Electrophysiology Office Note   Date:  03/30/2018   ID:  Spencer Dalton, Spencer Dalton Jul 15, 1985, MRN [id number]  PCP:  Patient, No Pcp Per  Cardiologist:  Hilty Primary Electrophysiologist:  Tannya Gonet Jorja Loa, MD    No chief complaint on file.    History of Present Illness: Spencer Dalton is a 33 y.o. male who is being seen today for the evaluation of WPW at the request of No ref. provider found. Presenting today for electrophysiology evaluation.  He has a history of palpitations and atypical chest pain.  He was recently seen in the emergency room and diagnosed with WPW.  He also has obstructive sleep apnea, moderate obesity, and tobacco abuse.  He has been having intermittent left-sided chest discomfort associated with rest and with exertion.  He also feels that he has palpitations.  His EKG shows sinus rhythm with delta waves suggestive of WPW.  He has not been on any therapy for this.  He denies any other history of atrial fibrillation or other known arrhythmias.  Today, denies symptoms of palpitations, chest pain, shortness of breath, orthopnea, PND, lower extremity edema, claudication, dizziness, presyncope, syncope, bleeding, or neurologic sequela. The patient is tolerating medications without difficulties. Plan for  ablation today.    Past Medical History:  Diagnosis Date  . OSA (obstructive sleep apnea) 04/05/2017  . Tobacco abuse 04/05/2017  . WPW (Wolff-Parkinson-White syndrome) 04/05/2017   No past surgical history on file.   No current facility-administered medications for this encounter.     Allergies:   Patient has no known allergies.   Social History:  The patient  reports that he quit smoking about 13 months ago. His smoking use included cigarettes. He smoked 1.00 pack per day. He has never used smokeless tobacco. He reports that he does not drink alcohol or use drugs.   Family History:  The patient's family history includes Diabetes in his mother; Heart attack in his maternal grandmother; Hypertension in his father; Stroke in his maternal grandfather.    ROS:  Please see the history of present illness.   Otherwise, review of systems is positive for none.   All other systems are reviewed and negative.   PHYSICAL EXAM: VS:  BP 133/74   Pulse 61   Temp 98.2 F (36.8 C) (Oral)   Ht 6\' 4"  (1.93 m)   Wt (!) 272.2 kg   SpO2 100%   BMI 73.03 kg/m  , BMI Body mass index is 73.03 kg/m. GEN: Well nourished, well developed, in no acute distress  HEENT: normal  Neck: no JVD, carotid bruits, or masses Cardiac: RRR; no murmurs, rubs, or gallops,no edema  Respiratory:  clear to auscultation bilaterally, normal work of breathing GI: soft, nontender, nondistended, + BS MS: no deformity or atrophy  Skin: warm and dry Neuro:  Strength and sensation are  intact Psych: euthymic mood, full affect   Recent Labs: 03/20/2018: BUN 15; Creatinine, Ser 1.02; Hemoglobin 17.2; Platelets 194; Potassium 5.0; Sodium 137    Lipid Panel  No results found for: CHOL, TRIG, HDL, CHOLHDL, VLDL, LDLCALC, LDLDIRECT   Wt Readings from Last 3 Encounters:  03/30/18 (!) 272.2 kg  02/23/18 (!) 139.3 kg  02/13/18 (!) 141.1 kg      Other studies Reviewed: Additional studies/ records that were reviewed today  include: Epic notes   ASSESSMENT AND PLAN:  1.  WPW: Currently symptomatic with palpitations.  Is certainly possible that he is having tachycardia related to his accessory pathway.  Due to that, we Hooria Gasparini plan for ablation.  Risks and benefits were discussed and include bleeding, tamponade, heart block, stroke.  The patient understand these risks and is agreed to the procedure.   Signed, Capri Raben Jorja Loa, MD  03/30/2018 10:41 AM

## 2018-03-30 NOTE — Progress Notes (Signed)
Site area: Right groin 7, 9, and 11 french venous sheath was removed  Site Prior to Removal:  Level 0  Pressure Applied For 25 MINUTES    Bedrest Beginning at 1800p  Manual:   Yes.    Patient Status During Pull:  stable  Post Pull Groin Site:  Level 0  Post Pull Instructions Given:  Yes.    Post Pull Pulses Present:  Yes.    Dressing Applied:  Yes.    Comments:  VS remain stable.  Fentanyl 25 given for back pain

## 2018-03-30 NOTE — Discharge Instructions (Signed)
Post procedure care instructions No driving for 4 days. No lifting over 5 lbs for 1 week. No vigorous or sexual activity for 1 week. You may return to work on 04/06/2018. Keep procedure site clean & dry. If you notice increased pain, swelling, bleeding or pus, call/return!  You may shower, but no soaking baths/hot tubs/pools for 1 week.

## 2018-03-31 ENCOUNTER — Telehealth: Payer: Self-pay | Admitting: Internal Medicine

## 2018-03-31 ENCOUNTER — Encounter (HOSPITAL_COMMUNITY): Payer: Self-pay | Admitting: Cardiology

## 2018-03-31 DIAGNOSIS — Z8679 Personal history of other diseases of the circulatory system: Secondary | ICD-10-CM

## 2018-03-31 DIAGNOSIS — Z9889 Other specified postprocedural states: Secondary | ICD-10-CM

## 2018-03-31 DIAGNOSIS — R002 Palpitations: Secondary | ICD-10-CM | POA: Diagnosis not present

## 2018-03-31 DIAGNOSIS — I456 Pre-excitation syndrome: Secondary | ICD-10-CM | POA: Diagnosis not present

## 2018-03-31 DIAGNOSIS — G4733 Obstructive sleep apnea (adult) (pediatric): Secondary | ICD-10-CM | POA: Diagnosis not present

## 2018-03-31 HISTORY — DX: Personal history of other diseases of the circulatory system: Z86.79

## 2018-03-31 HISTORY — DX: Personal history of other diseases of the circulatory system: Z98.890

## 2018-03-31 MED ORDER — INFLUENZA VAC SPLIT QUAD 0.5 ML IM SUSY: 0.5000 mL | PREFILLED_SYRINGE | INTRAMUSCULAR | Status: DC

## 2018-03-31 MED ORDER — ACETAMINOPHEN 325 MG PO TABS: 650.0000 mg | ORAL_TABLET | ORAL | Status: DC | PRN

## 2018-03-31 NOTE — Progress Notes (Signed)
Patient c/o left leg numbness below the knee.  Assessment showed + pedal pulses, capillary refill +, feels warm to the touch.  Patient was able to stand but feeling like left leg will give out if he walks.  Advise to stay in bed and for know.  Cardiology on call was paged.  I will keep monitoring the patient.

## 2018-03-31 NOTE — Progress Notes (Signed)
Patient's HR has gone down as low as 37.  At this time HR is maintaining in the 40s to mid 50s.  MD on call was notified.  I will keep monitoring patient.

## 2018-03-31 NOTE — Telephone Encounter (Signed)
Chest pain and back pain. D/w Dr. Graciela Husbands and advised to go to the ED to evaluate for ischemia with EKG and hematoma with CTA.

## 2018-03-31 NOTE — Discharge Summary (Addendum)
Discharge Summary    Patient ID: Spencer Dalton MRN: [id number]; DOB: 1985-02-19  Admit date: 03/30/2018 Discharge date: 03/31/2018  Primary Care Provider: Patient, No Pcp Per  Primary Cardiologist: Spencer Nose, MD  Primary Electrophysiologist:  Spencer Lemming, MD   Discharge Diagnoses    Principal Problem:   WPW (Wolff-Parkinson-White syndrome) Active Problems:   S/P radiofrequency ablation operation for arrhythmia   Allergies No Known Allergies  Diagnostic Studies/Procedures    03/30/18  Ablation  Measurements following ablation:  Following ablation, Rapid atrial pacing was again performed with PR<RR and an AV WCL of 310 msec.  V pacing was performed which revealed midline concentric decremental VA conduction with a single retrograde jump but no echo beats of tachycardias. The VA wenkebach 430 msec There was a single retrograde jump with echo beat but no arrhythmias induced.  .  No arrhythmias were induced. Following ablation the AH interval was 60 msec with an HV interval of 18 msec. The procedure was therefore considered completed. All catheters were removed and the sheaths were aspirated and flushed. The sheaths were removed and hemostasis was assured. EBL<7ml. There were no early apparent complications.   CONCLUSIONS:  1. Sinus rhythm upon presentation.  2. 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 Successful radiofrequency modification of the slow AV nodal pathway  4. No inducible arrhythmias following ablation.  5. No early apparent complications.   During this procedure the patient is administered a total of Versed 20 mg and Fentanyl 500 mcg to achieve and maintain moderate conscious sedation.  The patient's heart rate, blood pressure, and oxygen saturation are monitored continuously during the procedure. The period of conscious sedation  is 164 minutes, of which I was present face-to-face 100% of this time.  _____________   History of Present Illness     56 yoM with hx of palpitations, OSA, tobacco abuse.  His EKG with delta waves suggestive of WPW.  He has never had therapy for this.  He has prn metoprolol and he has chest pain.  He was seen by Dr. Elberta Dalton for WPW.   After discussion pt and Dr. Elberta Dalton  Decided for ablation.   He did have a stress test prior to ablation and this was neg for ischemia.   Pt presented 03/30/18 for ablation.  He underwent without complications.  Hospital Course     Consultants: none   Today pt has been seen and evaluated by Dr. Graciela Dalton.  Found stable for discharge. He did have HR drops to 37.  No change in meds.  Out of work until 04/04/18 work note written.   _____________  Discharge Vitals Blood pressure 126/70, pulse (!) 55, temperature 98.2 F (36.8 C), temperature source Oral, resp. rate 12, height 6\' 4"  (1.93 m), weight 136.1 kg, SpO2 97 %.  Filed Weights   03/30/18 1039  Weight: 136.1 kg    Labs & Radiologic Studies    CBC No results for input(s): WBC, NEUTROABS, HGB, HCT, MCV, PLT in the last 72 hours. Basic Metabolic Panel No results for input(s): NA, K, CL, CO2, GLUCOSE, BUN, CREATININE, CALCIUM, MG, PHOS in the last 72 hours. Liver Function Tests No results for input(s): AST, ALT, ALKPHOS, BILITOT, PROT, ALBUMIN in the last 72 hours. No results for input(s): LIPASE, AMYLASE in the last 72 hours. Cardiac Enzymes No results for input(s): CKTOTAL, CKMB, CKMBINDEX, TROPONINI in the last  72 hours. BNP Invalid input(s): POCBNP D-Dimer No results for input(s): DDIMER in the last 72 hours. Hemoglobin A1C No results for input(s): HGBA1C in the last 72 hours. Fasting Lipid Panel No results for input(s): CHOL, HDL, LDLCALC, TRIG, CHOLHDL, LDLDIRECT in the last 72 hours. Thyroid Function Tests No results for input(s): TSH, T4TOTAL, T3FREE, THYROIDAB in the last 72  hours.  Invalid input(s): FREET3 _____________  No results found. Disposition   Pt is being discharged home today in good condition.  Follow-up Plans & Appointments    Follow-up Information    Spencer Lemming, MD Follow up.   Specialty:  Cardiology Why:  You will be called by Dr. Gershon Dalton scheduler to make a 1 month follow up appointment Contact information: [address] [place] [address] [phone]           Post procedure care instructions No driving for 4 days. No lifting over 5 lbs for 1 week. No vigorous or sexual activity for 1 week. You may return to work on 04/06/2018. Keep procedure site clean & dry. If you notice increased pain, swelling, bleeding or pus, call/return!  You may shower, but no soaking baths/hot tubs/pools for 1 week.      Discharge Medications   Allergies as of 03/31/2018   No Known Allergies     Medication List    TAKE these medications   acetaminophen 325 MG tablet Commonly known as:  TYLENOL Take 2 tablets (650 mg total) by mouth every 4 (four) hours as needed for headache or mild pain.   albuterol 108 (90 Base) MCG/ACT inhaler Commonly known as:  PROVENTIL HFA;VENTOLIN HFA Inhale 2 puffs into the lungs every 6 (six) hours as needed for wheezing or shortness of breath.   ibuprofen 200 MG tablet Commonly known as:  ADVIL,MOTRIN Take 800 mg by mouth every 8 (eight) hours as needed (for pain).   metoprolol tartrate 25 MG tablet Commonly known as:  LOPRESSOR Take 0.5 tablets (12.5 mg total) by mouth 2 (two) times daily as needed (tachycardia).        Acute coronary syndrome (MI, NSTEMI, STEMI, etc) this admission?: No.    Outstanding Labs/Studies     Duration of Discharge Encounter   Greater than 30 minutes including physician time.  Signed, Spencer Boozer, NP 03/31/2018, 11:11 AM   See separate note

## 2018-03-31 NOTE — Progress Notes (Signed)
   Progress Note  Patient Name: Spencer Dalton Date of Encounter: 03/31/2018     Primary Electrophysiologist:WC  Patient Profile     33 y.o. male admitted for ablation of WPW-- failed procedure  Subjective   Feels better  Disappointed with the results of the procedure  Inpatient Medications    Scheduled Meds: . sodium chloride flush  3 mL Intravenous Q12H   Continuous Infusions: . sodium chloride     PRN Meds: sodium chloride, acetaminophen, albuterol, ibuprofen, metoprolol tartrate, ondansetron (ZOFRAN) IV, sodium chloride flush   Vital Signs    Vitals:   03/30/18 1701 03/30/18 1831 03/30/18 2102 03/31/18 0330  BP: (!) 147/90 (!) 118/95 (!) 116/47 126/70  Pulse: (!) 0  69 (!) 55  Resp: (!) 7 13 15 12   Temp:      TempSrc:      SpO2: 97% 99% 95% 97%  Weight:      Height:        Intake/Output Summary (Last 24 hours) at 03/31/2018 0817 Last data filed at 03/31/2018 0300 Gross per 24 hour  Intake 243 ml  Output -  Net 243 ml   Filed Weights   03/30/18 1039  Weight: 136.1 kg    Telemetry    Variable sinus rates - Personally Reviewed  ECG    Sinus with delta * - Personally Reviewed  Physical Exam    GEN: No acute distress.   Neck: JVD  *flat  Cardiac: RRR, no  murmurs, rubs, or gallops.  Respiratory: Clear to auscultation bilaterally. GI: Soft, nontender, non-distended  MS * edema; No deformity. Neuro:  Nonfocal  Groins ok  Psych: Normal affect  Skin Warm and dry   Labs    ChemistryNo results for input(s): NA, K, CL, CO2, GLUCOSE, BUN, CREATININE, CALCIUM, PROT, ALBUMIN, AST, ALT, ALKPHOS, BILITOT, GFRNONAA, GFRAA, ANIONGAP in the last 168 hours.   HematologyNo results for input(s): WBC, RBC, HGB, HCT, MCV, MCH, MCHC, RDW, PLT in the last 168 hours.  Cardiac EnzymesNo results for input(s): TROPONINI in the last 168 hours. No results for input(s): TROPIPOC in the last 168 hours.   BNPNo results for input(s): BNP, PROBNP in the last 168 hours.    DDimer No results for input(s): DDIMER in the last 168 hours.        Assessment & Plan    WPW  Failed ablation   Orthostatic tachycradia   Discharge  Instructions given  [facility]  Patient unaware of documentation of tachycardia.  I wonder to what degree his palpitations are related to his variable sinus rate which seems to be at least in part orthostatically mediated.  Signed, Sherryl Manges, MD  03/31/2018, 8:17 AM

## 2018-04-02 ENCOUNTER — Encounter (HOSPITAL_COMMUNITY): Payer: Self-pay | Admitting: Cardiology

## 2018-04-06 ENCOUNTER — Other Ambulatory Visit: Payer: Self-pay | Admitting: Internal Medicine

## 2018-04-25 ENCOUNTER — Encounter: Payer: Self-pay | Admitting: *Deleted

## 2018-04-25 ENCOUNTER — Telehealth: Payer: Self-pay | Admitting: Cardiology

## 2018-04-25 MED ORDER — METOPROLOL TARTRATE 25 MG PO TABS: 25.0000 mg | ORAL_TABLET | Freq: Two times a day (BID) | ORAL | 3 refills | Status: DC

## 2018-04-25 MED ORDER — FLECAINIDE ACETATE 50 MG PO TABS: 50.0000 mg | ORAL_TABLET | Freq: Two times a day (BID) | ORAL | 3 refills | Status: DC

## 2018-04-25 NOTE — Telephone Encounter (Signed)
Advised pt to start Flecainide 50 mg BID and Lopressor 25 mg BID, per Dr. Elberta Fortis. Pt agreeable to plan. Pt will monitor for new SE/issues after starting new medication. Pt aware I will discuss w/ Camnitz and call him with recommended follow up considering current Pandemic. Patient verbalized understanding and agreeable to plan.

## 2018-04-25 NOTE — Telephone Encounter (Signed)
Patient c/o Palpitations:  High priority if patient c/o lightheadedness, shortness of breath, or chest pain  1) How long have you had palpitations/irregular HR/ Afib? Are you having the symptoms now? One week   2) Are you currently experiencing lightheadedness, SOB or CP? Yes   3) Do you have a history of afib (atrial fibrillation) or irregular heart rhythm? Yes  4) Have you checked your BP or HR? (document readings if available): no   5) Are you experiencing any other symptoms? Light headed

## 2018-04-25 NOTE — Addendum Note (Signed)
Addended by: Baird Lyons on: 04/25/2018 05:16 PM   Modules accepted: Orders

## 2018-04-25 NOTE — Telephone Encounter (Signed)
Pt called stating he has felt worse the past week Had recent ablation in February Pt states he is feeling short or breath and dizzy at times-he reports it being worse when at rest Reports pressure in throat and chest  NO chest pain reported States he feels worse than before his ablation Pt reports HR of 81 Spoke with Camnitz RN Sherri who will arrange appt Routing the Sherri and Camnitz Pt verbalized understanding and agreed to call back if symptoms worsen.

## 2018-04-27 ENCOUNTER — Telehealth: Payer: Self-pay | Admitting: *Deleted

## 2018-04-27 NOTE — Telephone Encounter (Signed)
See office note from 04/25/2018. WebEx Virtual Visit Information given to Patient VIA MyChart

## 2018-04-30 NOTE — Telephone Encounter (Signed)
Attempted to reach patient to inform him cancelling appt for tomorrow and moving f/u out 3 months. Will try and reach pt again to inform

## 2018-05-01 ENCOUNTER — Ambulatory Visit: Payer: 59 | Admitting: Cardiology

## 2018-05-03 NOTE — Telephone Encounter (Signed)
Informed pt that office would call him to arrange 81mo OV. He is aware that under the current circumstances (COVID-19 pandemic) we may not be able to see him in the office 3 months but we will arrange as soon as we can post pandemic. Pt agreeable to calling office if any issues arise b/t now and then. Pt reports doing much better since starting the medication. Patient verbalized understanding and agreeable to plan.

## 2018-05-16 ENCOUNTER — Telehealth: Payer: Self-pay

## 2018-05-16 ENCOUNTER — Telehealth: Payer: Self-pay | Admitting: Internal Medicine

## 2018-05-16 NOTE — Telephone Encounter (Signed)
Virtual Visit Pre-Appointment Phone Call  Steps For Call:  1. Confirm consent - "In the setting of the current Covid19 crisis, you are scheduled for a (phone or video) visit with your provider on (date) at (time).  Just as we do with many in-office visits, in order for you to participate in this visit, we must obtain consent.  If you'd like, I can send this to your mychart (if signed up) or email for you to review.  Otherwise, I can obtain your verbal consent now.  All virtual visits are billed to your insurance company just like a normal visit would be.  By agreeing to a virtual visit, we'd like you to understand that the technology does not allow for your provider to perform an examination, and thus may limit your provider's ability to fully assess your condition.  Finally, though the technology is pretty good, we cannot assure that it will always work on either your or our end, and in the setting of a video visit, we may have to convert it to a phone-only visit.  In either situation, we cannot ensure that we have a secure connection.  Are you willing to proceed?" STAFF: Did the patient verbally acknowledge consent to telehealth visit? Patient provided verbal consent.   2. Confirm the BEST phone number to call the day of the visit by including in appointment notes  3. Give patient instructions for WebEx/MyChart download to smartphone as below or Doximity/Doxy.me if video visit (depending on what platform provider is using)  4. Advise patient to be prepared with their blood pressure, heart rate, weight, any heart rhythm information, their current medicines, and a piece of paper and pen handy for any instructions they may receive the day of their visit  5. Inform patient they will receive a phone call 15 minutes prior to their appointment time (may be from unknown caller ID) so they should be prepared to answer  6. Confirm that appointment type is correct in Epic appointment notes (VIDEO vs  PHONE)     TELEPHONE CALL NOTE  Spencer Dalton has been deemed a candidate for a follow-up tele-health visit to limit community exposure during the Covid-19 pandemic. I spoke with the patient via phone to ensure availability of phone/video source, confirm preferred email & phone number, and discuss instructions and expectations.  I reminded Spencer Dalton to be prepared with any vital sign and/or heart rhythm information that could potentially be obtained via home monitoring, at the time of his visit. I reminded Spencer Dalton to expect a phone call at the time of his visit if his visit.  Spencer Dalton, CMA 05/16/2018 1:31 PM   INSTRUCTIONS FOR DOWNLOADING THE WEBEX APP TO SMARTPHONE  - If Apple, ask patient to go to [employer] and type in WebEx in the search bar. Download [employer], the blue/green circle. If Android, go to [employer] and type in [employer] in the search bar. The app is free but as with any other app downloads, their phone may require them to verify saved payment information or Apple/Android password.  - The patient does NOT have to create an account. - On the day of the visit, the assist will walk the patient through joining the meeting with the meeting number/password.  INSTRUCTIONS FOR DOWNLOADING THE MYCHART APP TO SMARTPHONE  - The patient must first make sure to have activated MyChart and know their login information - If Apple, go to [employer] and  type in MyChart in the search bar and download the app. If Android, ask patient to go to [employer] and type in [employer] in the search bar and download the app. The app is free but as with any other app downloads, their phone may require them to verify saved payment information or Apple/Android password.  - The patient will need to then log into the app with their MyChart username and password, and select [facility] as their healthcare provider to link the account. When it is time for your visit, go to  the MyChart app, find appointments, and click Begin Video Visit. Be sure to Select Allow for your device to access the Microphone and Camera for your visit. You will then be connected, and your provider will be with you shortly.  **If they have any issues connecting, or need assistance please contact MyChart service desk [phone])**  **If using a computer, in order to ensure the best quality for their visit they will need to use either of the following Internet Browsers: [employer], or Google Chrome**  IF USING DOXIMITY or DOXY.ME - The patient will receive a link just prior to their visit, either by text or email (to be determined day of appointment depending on if it's doxy.me or Doximity).     FULL LENGTH CONSENT FOR TELE-HEALTH VISIT   I hereby voluntarily request, consent and authorize [facility] and its employed or contracted physicians, physician assistants, nurse practitioners or other licensed health care professionals (the Practitioner), to provide me with telemedicine health care services (the "Services") as deemed necessary by the treating Practitioner. I acknowledge and consent to receive the Services by the Practitioner via telemedicine. I understand that the telemedicine visit will involve communicating with the Practitioner through live audiovisual communication technology and the disclosure of certain medical information by electronic transmission. I acknowledge that I have been given the opportunity to request an in-person assessment or other available alternative prior to the telemedicine visit and am voluntarily participating in the telemedicine visit.  I understand that I have the right to withhold or withdraw my consent to the use of telemedicine in the course of my care at any time, without affecting my right to future care or treatment, and that the Practitioner or I may terminate the telemedicine visit at any time. I understand that I have the right to  inspect all information obtained and/or recorded in the course of the telemedicine visit and may receive copies of available information for a reasonable fee.  I understand that some of the potential risks of receiving the Services via telemedicine include:  Marland Kitchen Delay or interruption in medical evaluation due to technological equipment failure or disruption; . Information transmitted may not be sufficient (e.g. poor resolution of images) to allow for appropriate medical decision making by the Practitioner; and/or  . In rare instances, security protocols could fail, causing a breach of personal health information.  Furthermore, I acknowledge that it is my responsibility to provide information about my medical history, conditions and care that is complete and accurate to the best of my ability. I acknowledge that Practitioner's advice, recommendations, and/or decision may be based on factors not within their control, such as incomplete or inaccurate data provided by me or distortions of diagnostic images or specimens that may result from electronic transmissions. I understand that the practice of medicine is not an exact science and that Practitioner makes no warranties or guarantees regarding treatment outcomes. I acknowledge that I will receive  a copy of this consent concurrently upon execution via email to the email address I last provided but may also request a printed copy by calling the office of [facility].    I understand that my insurance will be billed for this visit.   I have read or had this consent read to me. . I understand the contents of this consent, which adequately explains the benefits and risks of the Services being provided via telemedicine.  . I have been provided ample opportunity to ask questions regarding this consent and the Services and have had my questions answered to my satisfaction. . I give my informed consent for the services to be provided through the use of  telemedicine in my medical care  By participating in this telemedicine visit I agree to the above.

## 2018-05-16 NOTE — Telephone Encounter (Signed)
Mychart, smartphone, pre reg complete 05/16/18 AF °

## 2018-05-17 ENCOUNTER — Telehealth (INDEPENDENT_AMBULATORY_CARE_PROVIDER_SITE_OTHER): Payer: 59 | Admitting: Internal Medicine

## 2018-05-17 ENCOUNTER — Encounter: Payer: Self-pay | Admitting: Internal Medicine

## 2018-05-17 VITALS — Ht 76.0 in | Wt 285.0 lb

## 2018-05-17 DIAGNOSIS — R0789 Other chest pain: Secondary | ICD-10-CM | POA: Diagnosis not present

## 2018-05-17 DIAGNOSIS — Z7189 Other specified counseling: Secondary | ICD-10-CM

## 2018-05-17 DIAGNOSIS — I456 Pre-excitation syndrome: Secondary | ICD-10-CM

## 2018-05-17 DIAGNOSIS — I471 Supraventricular tachycardia: Secondary | ICD-10-CM

## 2018-05-17 DIAGNOSIS — Z9889 Other specified postprocedural states: Secondary | ICD-10-CM | POA: Diagnosis not present

## 2018-05-17 MED ORDER — FLECAINIDE ACETATE 50 MG PO TABS: 75.0000 mg | ORAL_TABLET | Freq: Two times a day (BID) | ORAL | 3 refills | Status: AC

## 2018-05-17 NOTE — Patient Instructions (Signed)
Medication Instructions:  Increase: Flecainide 75 mg two times a day  If you need a refill on your cardiac medications before your next appointment, please call your pharmacy.   Lab work: None  Testing/Procedures: None  Follow-Up: Your physician recommends that you schedule a follow-up appointment in 2-4 weeks with Dr. Elberta Fortis.

## 2018-05-17 NOTE — Progress Notes (Signed)
Virtual Visit via Video Note   This visit type was conducted due to national recommendations for restrictions regarding the COVID-19 Pandemic (e.g. social distancing) in an effort to limit this patient's exposure and mitigate transmission in our community.  Due to his co-morbid illnesses, this patient is at least at moderate risk for complications without adequate follow up.  This format is felt to be most appropriate for this patient at this time.  All issues noted in this document were discussed and addressed.  A limited physical exam was performed with this format.  Please refer to the patient's chart for his consent to telehealth for [facility].   Evaluation Performed:  Video visit  Date:  05/17/2018   ID:  Spencer Dalton, DOB May 03, 1985, MRN [id number]  Patient Location:  [address] [place] [address]  Provider location:   [address], [address] [place], [place] [address]  PCP:  Patient, No Pcp Per  Cardiologist:  Chrystie Nose, MD Electrophysiologist:  Will Jorja Loa, MD   Chief Complaint:  Recurrent palpitations  History of Present Illness:    Spencer Dalton is a 33 y.o. male who presents via audio/video conferencing for a telehealth visit today.  Spencer Dalton was seen today for follow-up of SVT ablation.  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 Elberta Fortis.  He underwent radiofrequency ablation in February 2019.  He reported after the procedure feeling somewhat worse and even more symptomatic according to himself and was placed on flecainide 50 mg twice daily.  He said initially this seemed to help his symptoms in addition to metoprolol, but recently he has had more palpitations.  He is had several episodes of significant tachycardia and palpitations.  He denies any chest pain with this.  Did have a stress test which was negative for ischemia earlier this year.  The patient does not have symptoms concerning for COVID-19 infection (fever, chills, cough, or  new SHORTNESS OF BREATH).    Prior CV studies:   The following studies were reviewed today:  EP ablation report  PMHx:  Past Medical History:  Diagnosis Date  . OSA (obstructive sleep apnea) 04/05/2017  . S/P radiofrequency ablation operation for arrhythmia 03/31/2018  . Tobacco abuse 04/05/2017  . WPW (Wolff-Parkinson-White syndrome) 04/05/2017    Past Surgical History:  Procedure Laterality Date  . SVT ABLATION N/A 03/30/2018   Procedure: SVT ABLATION;  Surgeon: Regan Lemming, MD;  Location: [facility] INVASIVE CV LAB;  Service: Cardiovascular;  Laterality: N/A;    FAMHx:  Family History  Problem Relation Age of Onset  . Diabetes Mother   . Hypertension Father   . Heart attack Maternal Grandmother   . Stroke Maternal Grandfather     SOCHx:   reports that he quit smoking about 15 months ago. His smoking use included cigarettes. He smoked 1.00 pack per day. He has never used smokeless tobacco. He reports that he does not drink alcohol or use drugs.  ALLERGIES:  No Known Allergies  MEDS:  Current Meds  Medication Sig  . acetaminophen (TYLENOL) 325 MG tablet Take 2 tablets (650 mg total) by mouth every 4 (four) hours as needed for headache or mild pain.  Marland Kitchen albuterol (PROVENTIL HFA;VENTOLIN HFA) 108 (90 Base) MCG/ACT inhaler Inhale 2 puffs into the lungs every 6 (six) hours as needed for wheezing or shortness of breath.  . flecainide (TAMBOCOR) 50 MG tablet Take 1.5 tablets (75 mg total) by mouth 2 (two) times daily.  Marland Kitchen  ibuprofen (ADVIL,MOTRIN) 200 MG tablet Take 800 mg by mouth every 8 (eight) hours as needed (for pain).  . metoprolol tartrate (LOPRESSOR) 25 MG tablet Take 1 tablet (25 mg total) by mouth 2 (two) times daily. Changed to  BID  . [DISCONTINUED] flecainide (TAMBOCOR) 50 MG tablet Take 1 tablet (50 mg total) by mouth 2 (two) times daily.     ROS: Pertinent items noted in HPI and remainder of comprehensive ROS otherwise negative.  Labs/Other Tests and Data  Reviewed:    Recent Labs: 03/20/2018: BUN 15; Creatinine, Ser 1.02; Hemoglobin 17.2; Platelets 194; Potassium 5.0; Sodium 137   Recent Lipid Panel No results found for: CHOL, TRIG, HDL, CHOLHDL, LDLCALC, LDLDIRECT  Wt Readings from Last 3 Encounters:  05/17/18 285 lb (129.3 kg)  03/30/18 300 lb (136.1 kg)  02/23/18 (!) 307 lb (139.3 kg)     Exam:    Vital Signs:  Ht  (1.93 m)   Wt 285 lb (129.3 kg)   BMI 34.69 kg/m    General appearance: alert and no distress Lungs: No obvious respiratory difficulty Extremities: extremities normal, atraumatic, no cyanosis or edema Skin: Skin color, texture, turgor normal. No rashes or lesions Neurologic: Mental status: Alert, oriented, thought content appropriate Psych: Pleasant  ASSESSMENT & PLAN:    1. WPW status post ablation (03/2018)-[place] 2. Recurrent palpitations 3. Atypical chest pain 4. Anxiety  Spencer Dalton has reported recurrent palpitations despite antiarrhythmic therapy.  He is currently on flecainide 50 mg twice daily.  He has paroxysmal episodes where he feels like his heart is racing and beating out of his chest.  These seem to come on somewhat spontaneously.  He has been able to exercise.  He denies any significant cardiac chest pain and did have negative stress testing earlier this year.  He is also on a beta-blocker.  I advised him to increase his flecainide further to 75 mg twice daily.  We will arrange for an ED visit follow-up with Dr. Elberta Fortis in 2 to 4 weeks to see if this is helped his symptoms.  He may need evaluation for repeat ablation.  He can follow-up with EP as this is his primary issue going forward.  COVID-19 Education: The signs and symptoms of COVID-19 were discussed with the patient and how to seek care for testing (follow up with PCP or arrange E-visit).  The importance of social distancing was discussed today.  Patient Risk:   After full review of this patients clinical status, I feel that they are at  least moderate risk at this time.  Time:   Today, I have spent 25 minutes with the patient with telehealth technology discussing applications, WPW, prior ablation and medical therapy suggestions.     Medication Adjustments/Labs and Tests Ordered: Current medicines are reviewed at length with the patient today.  Concerns regarding medicines are outlined above.   Tests Ordered: No orders of the defined types were placed in this encounter.   Medication Changes: Meds ordered this encounter  Medications  . flecainide (TAMBOCOR) 50 MG tablet    Sig: Take 1.5 tablets (75 mg total) by mouth 2 (two) times daily.    Dispense:  270 tablet    Refill:  3    Disposition:  prn  Chrystie Nose, MD, [facility], FACP  [place]  [facility]  Medical Director of the [facility]  Cardiovascular Risk Reduction [facility] Diplomate of the American Board of Clinical Lipidology Attending Cardiologist  Direct Dial: [phone]  Fax: [phone]  Website:  [email or web address]  Chrystie NoseKenneth C Hilty, MD  05/17/2018 11:28 AM

## 2018-05-18 ENCOUNTER — Ambulatory Visit: Payer: 59 | Admitting: Internal Medicine

## 2018-07-31 ENCOUNTER — Telehealth: Payer: Self-pay | Admitting: *Deleted

## 2018-07-31 NOTE — Telephone Encounter (Signed)
Called pt to discuss scheduling appt w/ Dr. Lovena Le to discuss repeat ablation.  Pt is agreeable to plan. He understands office will call him to arrange consult.

## 2018-08-27 ENCOUNTER — Telehealth: Payer: Self-pay | Admitting: Internal Medicine

## 2018-08-27 NOTE — Telephone Encounter (Signed)
New Message    No voicemail set up Called to confirm appt and answer covid questions

## 2018-08-28 ENCOUNTER — Ambulatory Visit: Payer: 59 | Admitting: Internal Medicine

## 2018-10-02 ENCOUNTER — Encounter (HOSPITAL_COMMUNITY): Payer: Self-pay | Admitting: Cardiology

## 2018-12-29 IMAGING — DX DG CHEST 2V
2 series · 3 of 3 positions shown · non-contrast
Comparison: None.

CLINICAL DATA: Dyspnea for 1 year

EXAM:
CHEST - 2 VIEW

[Series 1: chest pa · 0.14mm/px · 2 of 2 slices shown]
[im 1/2]
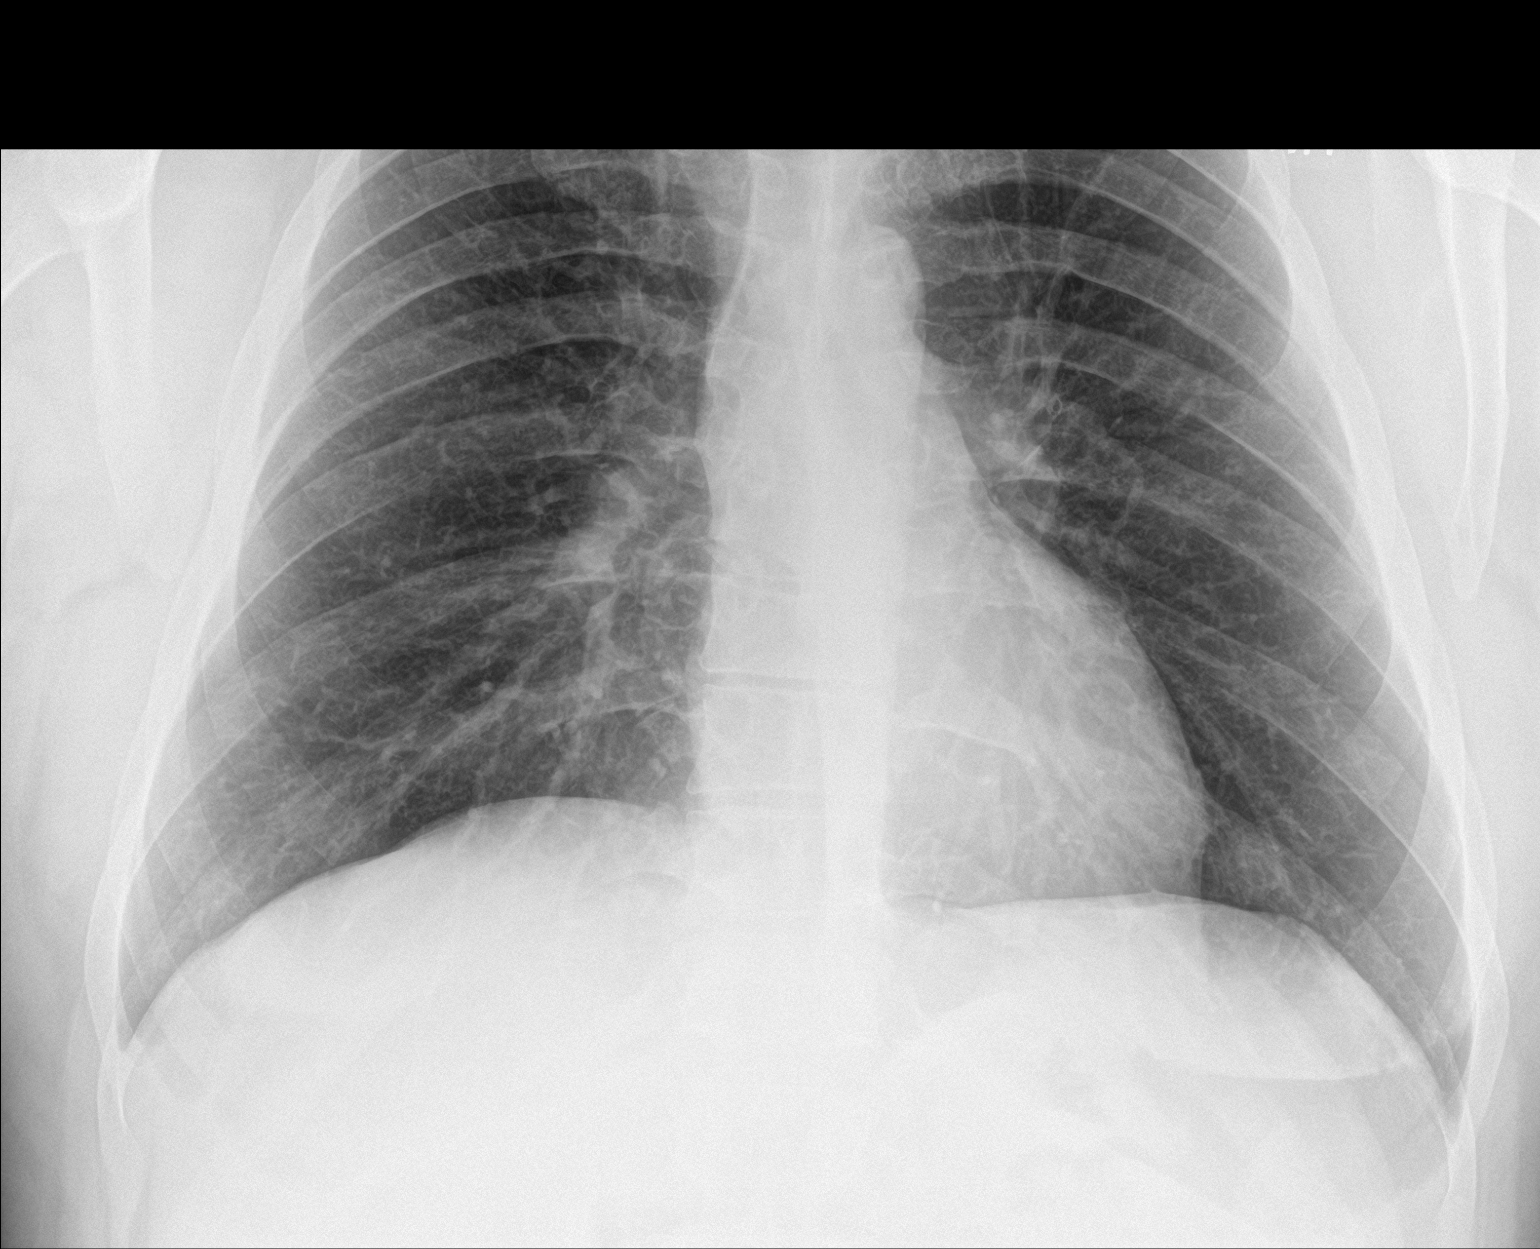
[im 2/2]
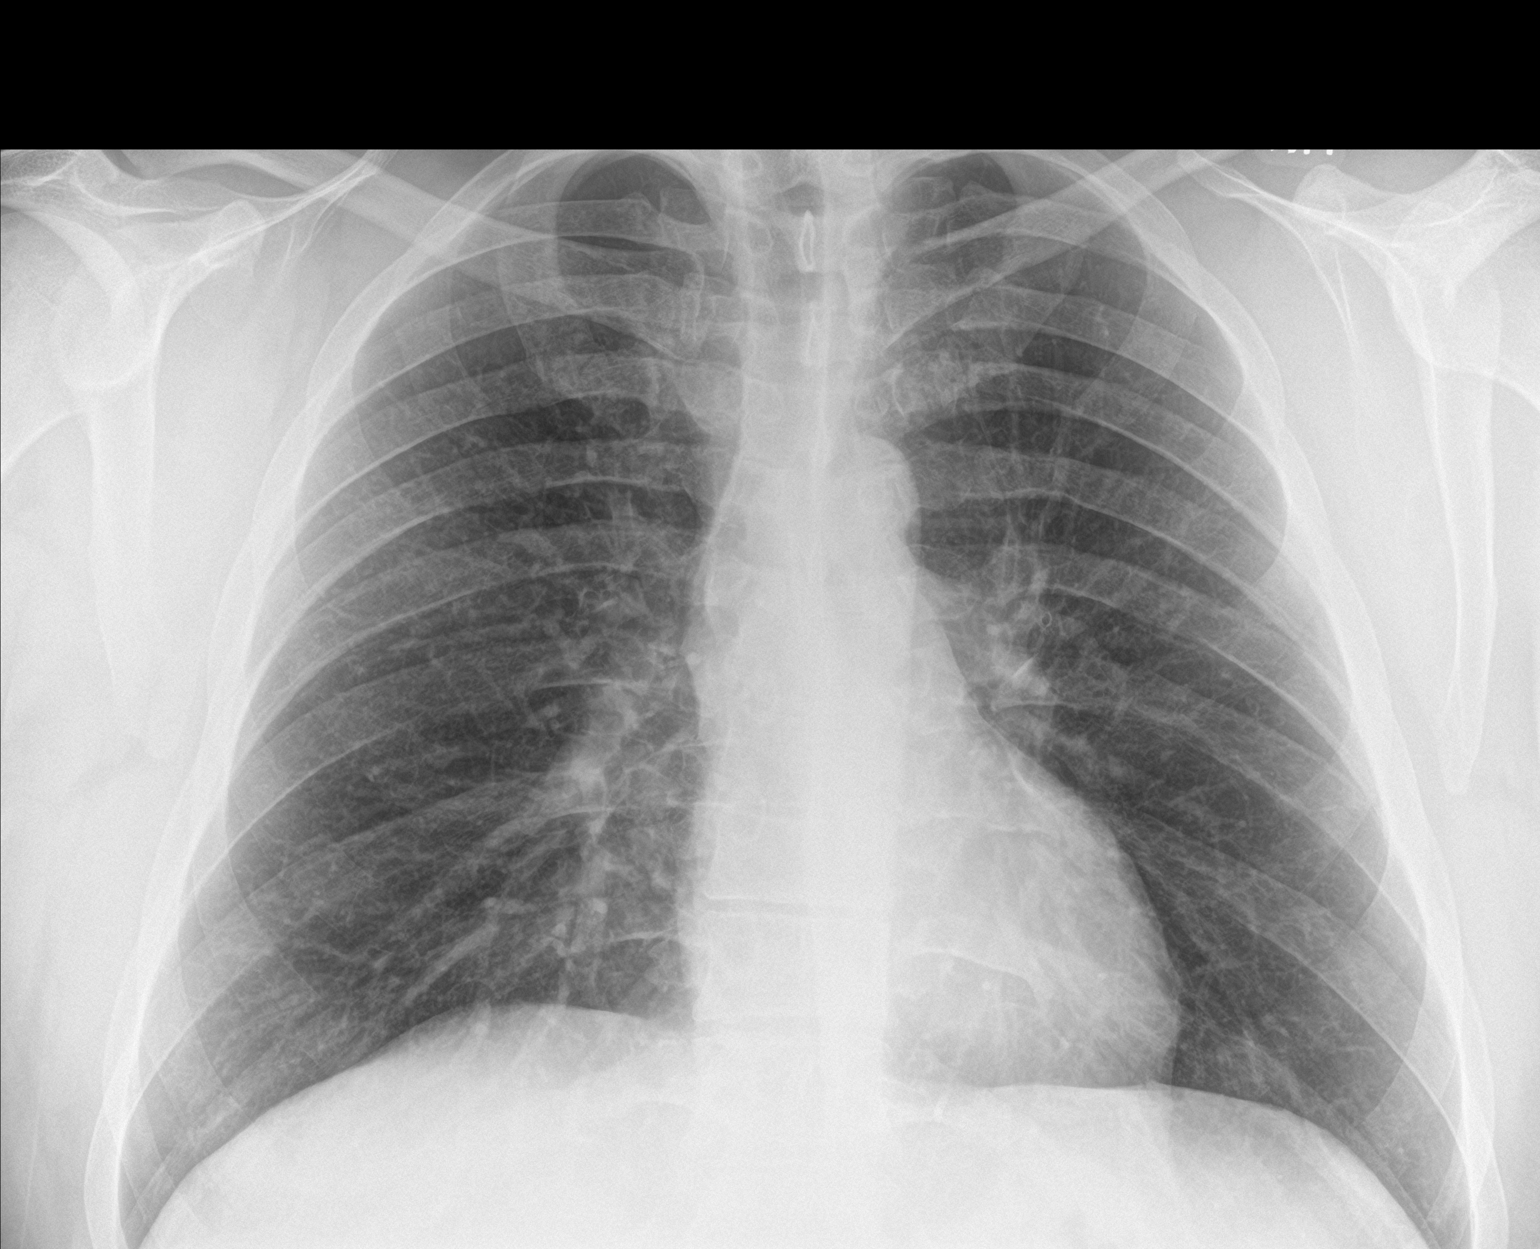

[chest lat]
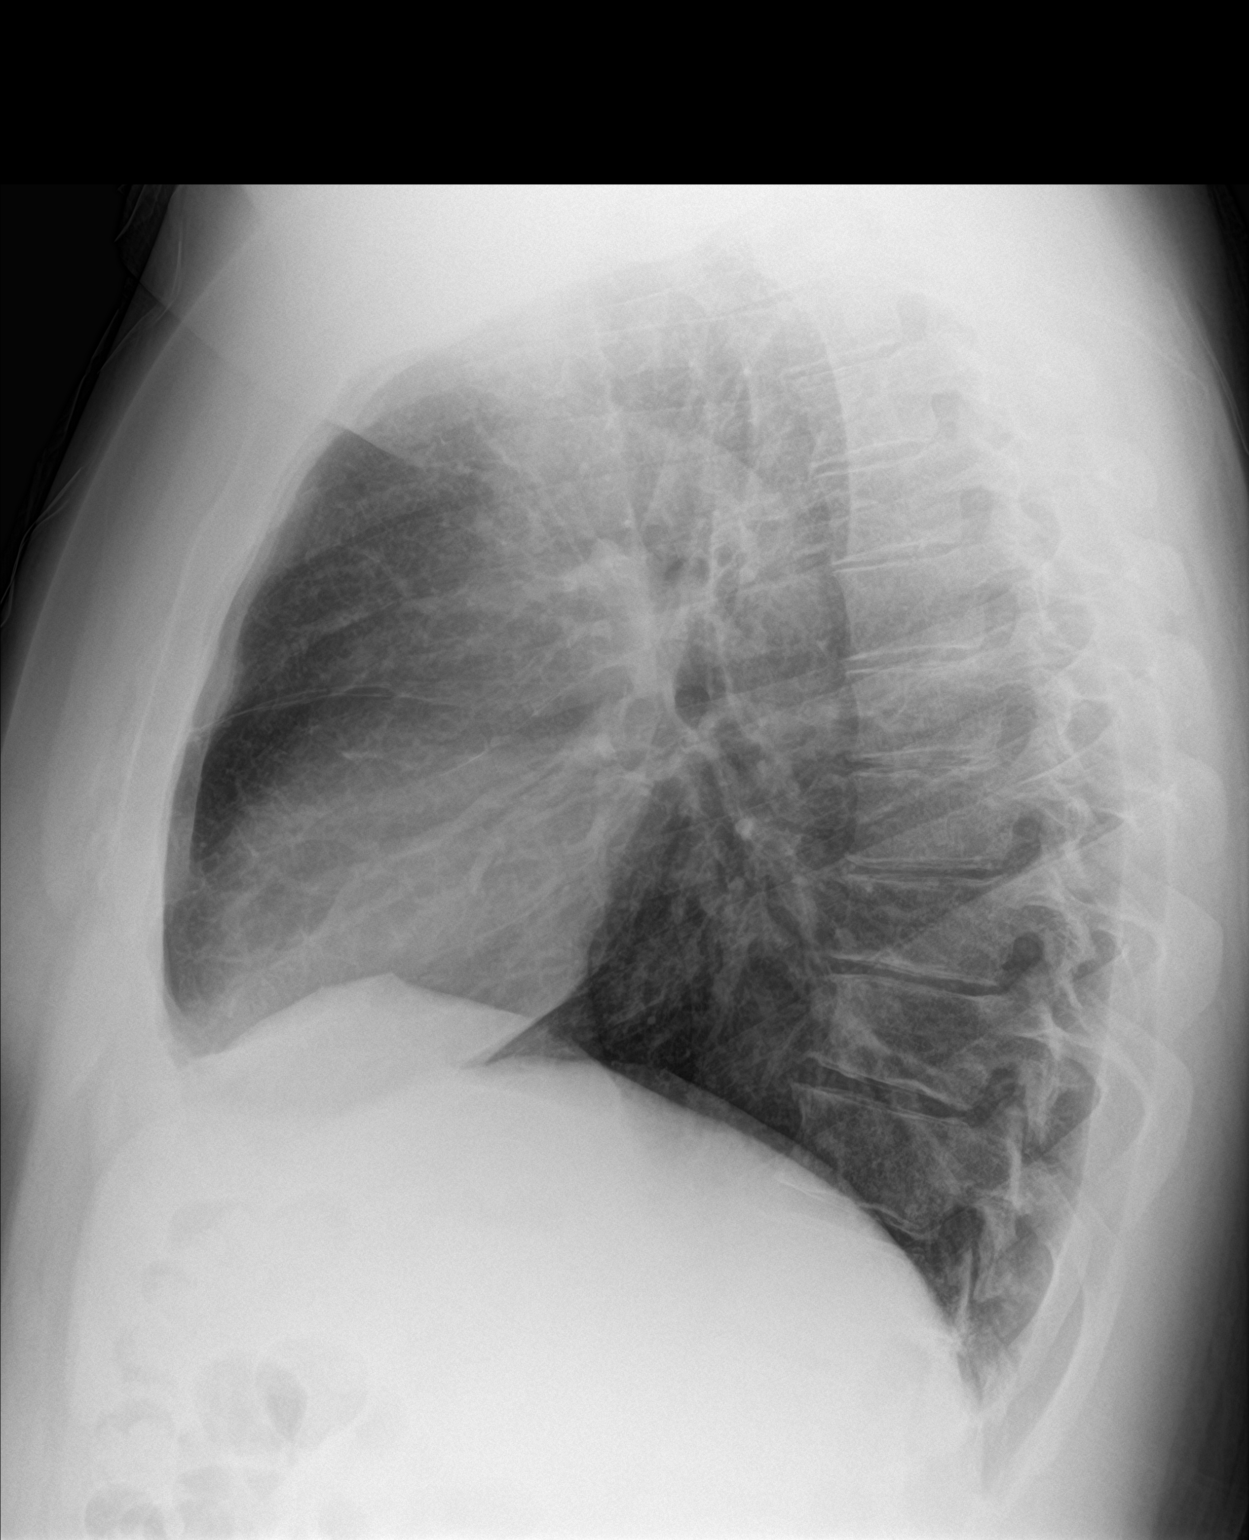

[3 of 3 positions shown; findings below may reference images not displayed]

FINDINGS: The heart size and mediastinal contours are within normal limits.
Both lungs are clear. The visualized skeletal structures are
unremarkable.
IMPRESSION: No active cardiopulmonary disease.

## 2019-01-07 IMAGING — CR DG CHEST 2V
2 series · 2 of 2 positions shown · non-contrast
Comparison: Chest radiograph 04/05/2017

CLINICAL DATA: Chest pain

EXAM:
CHEST - 2 VIEW

[w chest pa]
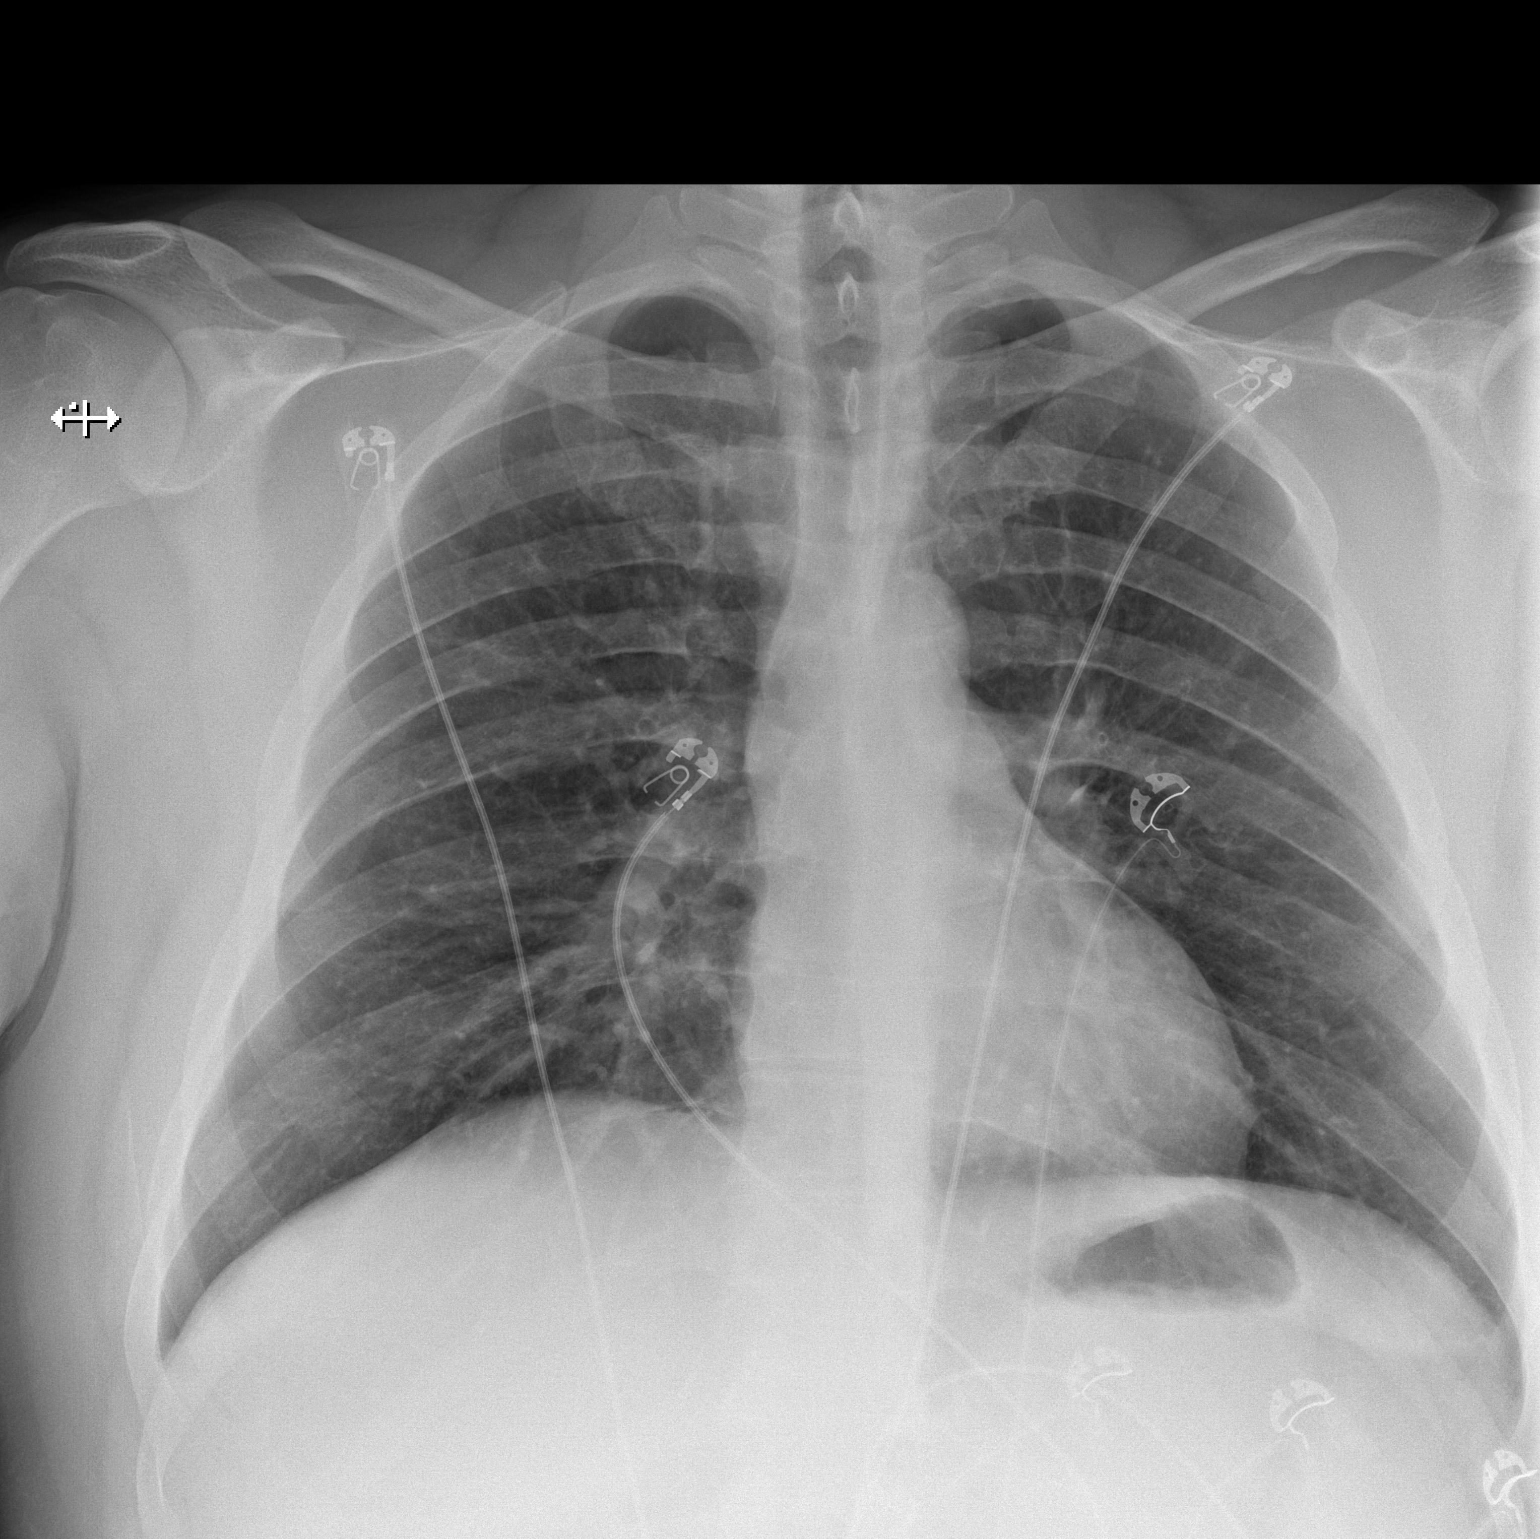

[w chest lat]
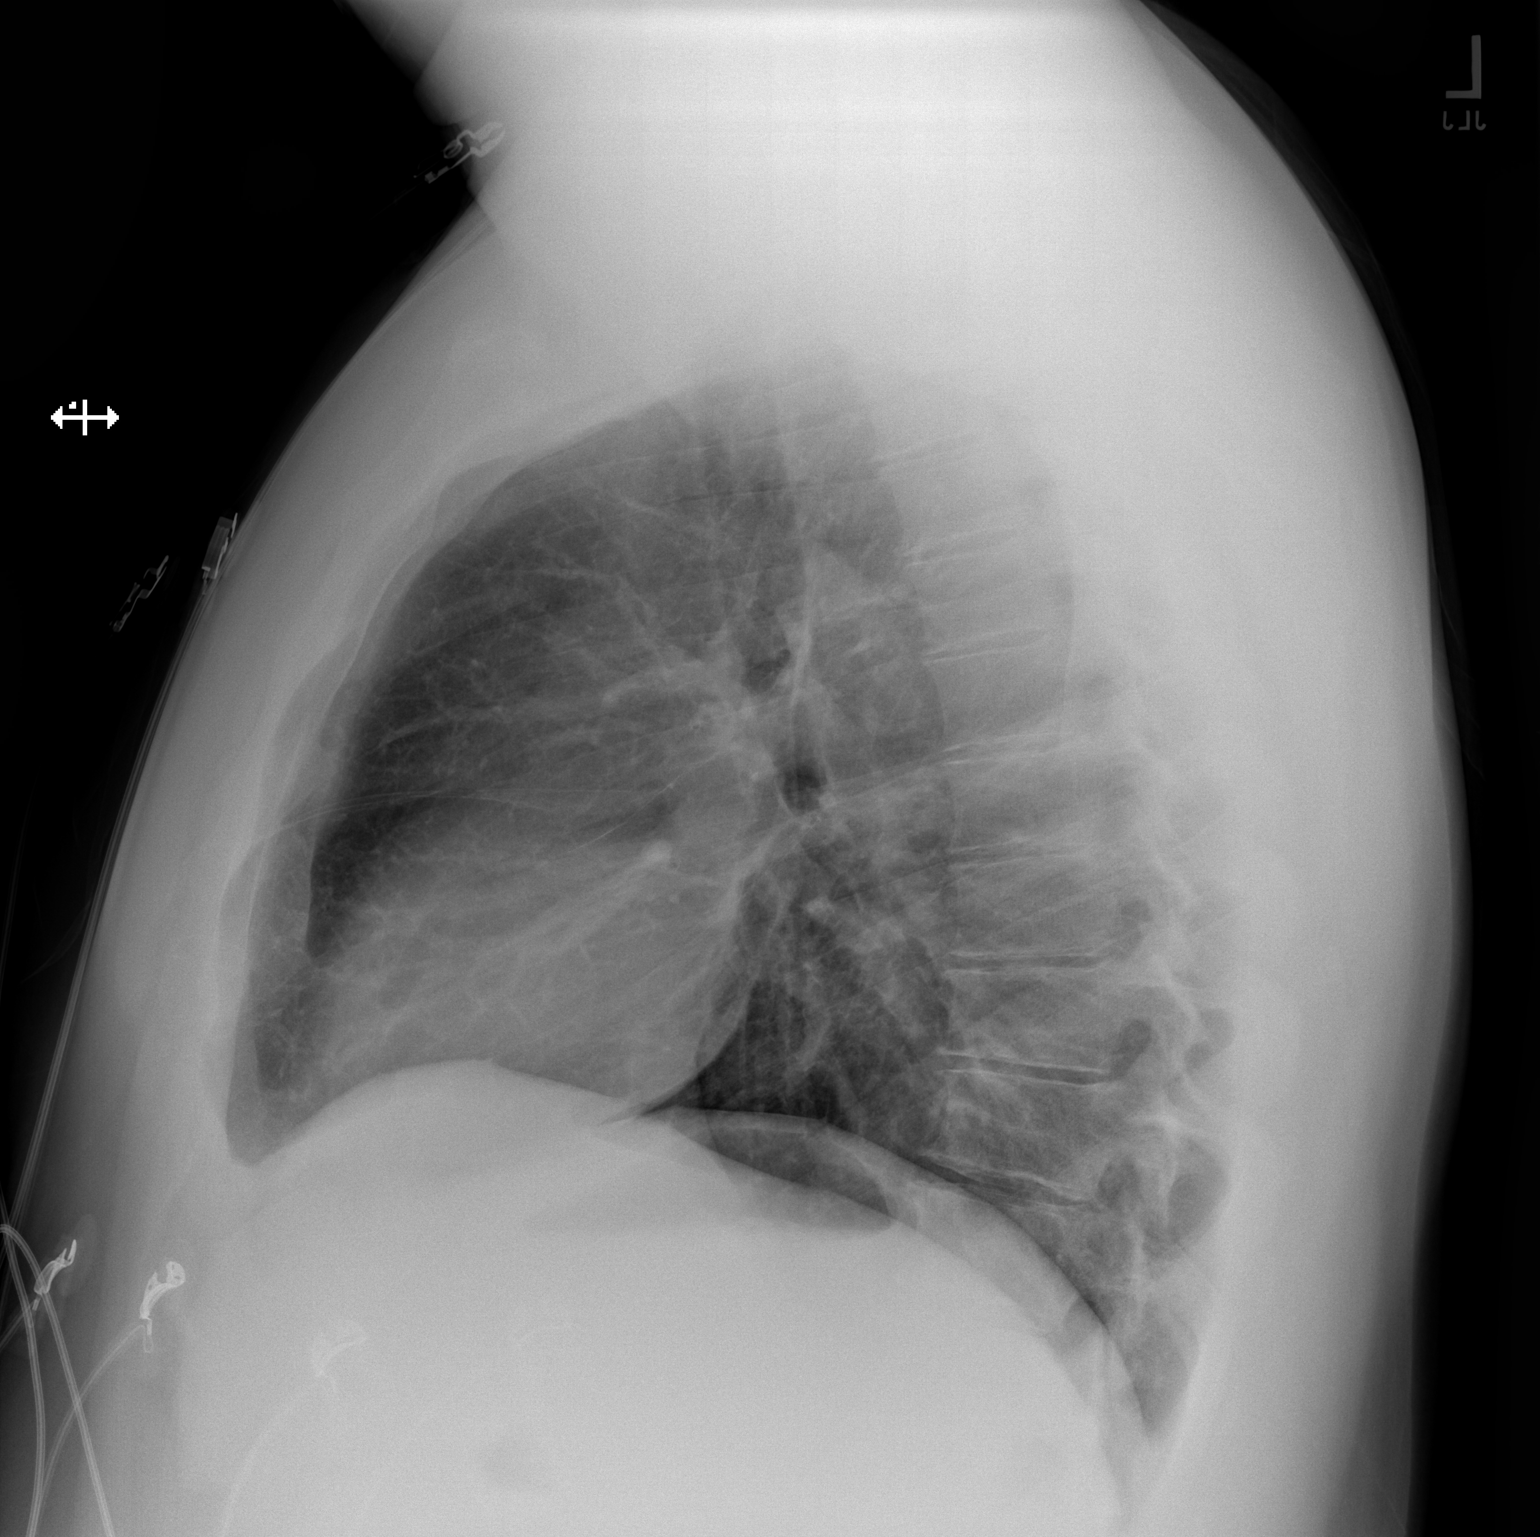

[2 of 2 positions shown; findings below may reference images not displayed]

FINDINGS: The heart size and mediastinal contours are within normal limits.
Both lungs are clear. The visualized skeletal structures are
unremarkable.
IMPRESSION: No active cardiopulmonary disease.

## 2019-01-30 ENCOUNTER — Emergency Department (HOSPITAL_BASED_OUTPATIENT_CLINIC_OR_DEPARTMENT_OTHER)
Admission: EM | Admit: 2019-01-30 | Discharge: 2019-01-30 | Disposition: A | Discharge status: Home / Self Care | Payer: 59 | Attending: Emergency Medicine | Admitting: Emergency Medicine

## 2019-01-30 ENCOUNTER — Other Ambulatory Visit: Payer: Self-pay

## 2019-01-30 ENCOUNTER — Encounter (HOSPITAL_BASED_OUTPATIENT_CLINIC_OR_DEPARTMENT_OTHER): Payer: Self-pay | Admitting: Emergency Medicine

## 2019-01-30 DIAGNOSIS — R1310 Dysphagia, unspecified: Secondary | ICD-10-CM | POA: Diagnosis not present

## 2019-01-30 DIAGNOSIS — R0602 Shortness of breath: Secondary | ICD-10-CM | POA: Diagnosis not present

## 2019-01-30 DIAGNOSIS — T782XXA Anaphylactic shock, unspecified, initial encounter: Secondary | ICD-10-CM

## 2019-01-30 DIAGNOSIS — Z87891 Personal history of nicotine dependence: Secondary | ICD-10-CM | POA: Insufficient documentation

## 2019-01-30 DIAGNOSIS — R221 Localized swelling, mass and lump, neck: Secondary | ICD-10-CM | POA: Diagnosis present

## 2019-01-30 DIAGNOSIS — L299 Pruritus, unspecified: Secondary | ICD-10-CM | POA: Diagnosis not present

## 2019-01-30 MED ORDER — METHYLPREDNISOLONE SODIUM SUCC 125 MG IJ SOLR
INTRAMUSCULAR | Status: AC
  Administered 2019-01-30: 17:00:00 125 mg
  Filled 2019-01-30: qty 2

## 2019-01-30 MED ORDER — EPINEPHRINE 0.3 MG/0.3ML IJ SOAJ
INTRAMUSCULAR | Status: AC
  Administered 2019-01-30: 0.3 mg
  Filled 2019-01-30: qty 0.3

## 2019-01-30 MED ORDER — EPINEPHRINE 0.3 MG/0.3ML IJ SOAJ: 0.3000 mg | INTRAMUSCULAR | 0 refills | Status: AC | PRN

## 2019-01-30 MED ORDER — FAMOTIDINE IN NACL 20-0.9 MG/50ML-% IV SOLN
INTRAVENOUS | Status: AC
  Administered 2019-01-30: 20 mg
  Filled 2019-01-30: qty 50

## 2019-01-30 MED ORDER — PREDNISONE 20 MG PO TABS: ORAL_TABLET | ORAL | 0 refills | Status: AC

## 2019-01-30 NOTE — ED Notes (Signed)
Received report

## 2019-01-30 NOTE — ED Notes (Signed)
ED Provider at bedside. 

## 2019-01-30 NOTE — ED Notes (Signed)
Pt wanting to go home, EDP made aware.

## 2019-01-30 NOTE — ED Notes (Signed)
Disregard RESP 0; actual resp 18

## 2019-01-30 NOTE — ED Notes (Signed)
Airway noted on assessment to be narrowed.  Dr. Tamera Punt notified.  Came to bedside immediately.

## 2019-01-30 NOTE — ED Triage Notes (Signed)
30 min ago pt ate an apple fritter at work and then he started itching in the back of his throat.  Throat swelling.  Itching of ears and nose.  Face got red.

## 2019-01-30 NOTE — ED Provider Notes (Signed)
[facility] EMERGENCY DEPARTMENT Provider Note   CSN: [id number] Arrival date & time: 01/30/19  1627     History Chief Complaint  Patient presents with  . Allergic Reaction    Spencer Dalton is a 33 y.o. male.  Patient is a 33 year old male who presents with throat swelling.  He states about 30 minutes prior to arrival he ate an apple fritter at work and shortly after that started having itching to the back of his throat.  He felt like his face got flushed and was itchy and he noticed that his throat started swelling.  His ears were itching.  He did not note any other rash.  No vomiting.  He has had some trouble swallowing and feels short of breath.  He has no history of allergic reactions in the past.  No history of known food allergies.  No other recent illnesses.        Past Medical History:  Diagnosis Date  . OSA (obstructive sleep apnea) 04/05/2017  . S/P radiofrequency ablation operation for arrhythmia 03/31/2018  . Tobacco abuse 04/05/2017  . WPW (Wolff-Parkinson-White syndrome) 04/05/2017    Patient Active Problem List   Diagnosis Date Noted  . S/P radiofrequency ablation operation for arrhythmia 03/31/2018  . Atypical chest pain 02/13/2018  . OSA (obstructive sleep apnea) 04/05/2017  . Tobacco abuse 04/05/2017  . WPW (Wolff-Parkinson-White syndrome) 04/05/2017    Past Surgical History:  Procedure Laterality Date  . SVT ABLATION N/A 03/30/2018   Procedure: SVT ABLATION;  Surgeon: Regan Lemming, MD;  Location: [facility] INVASIVE CV LAB;  Service: Cardiovascular;  Laterality: N/A;       Family History  Problem Relation Age of Onset  . Diabetes Mother   . Hypertension Father   . Heart attack Maternal Grandmother   . Stroke Maternal Grandfather     Social History   Tobacco Use  . Smoking status: Former Smoker    Packs/day: 1.00    Types: Cigarettes    Quit date: 01/31/2017    Years since quitting: 1.9  . Smokeless tobacco: Never Used  Substance Use  Topics  . Alcohol use: No  . Drug use: No    Home Medications Prior to Admission medications   Medication Sig Start Date End Date Taking? Authorizing Provider  albuterol (PROVENTIL HFA;VENTOLIN HFA) 108 (90 Base) MCG/ACT inhaler Inhale 2 puffs into the lungs every 6 (six) hours as needed for wheezing or shortness of breath. 04/07/17   Oretha Milch, MD  EPINEPHrine 0.3 mg/0.3 mL IJ SOAJ injection Inject 0.3 mLs (0.3 mg total) into the muscle as needed for anaphylaxis. 01/30/19   Rolan Bucco, MD  flecainide (TAMBOCOR) 50 MG tablet Take 1.5 tablets (75 mg total) by mouth 2 (two) times daily. 05/17/18   Hilty, Lisette Abu, MD  predniSONE (DELTASONE) 20 MG tablet 3 tabs po day one, then 2 po daily x 4 days 01/30/19   Rolan Bucco, MD    Allergies    Patient has no known allergies.  Review of Systems   Review of Systems  Constitutional: Negative for chills, diaphoresis, fatigue and fever.  HENT: Positive for facial swelling, trouble swallowing and voice change. Negative for congestion, rhinorrhea and sneezing.   Eyes: Negative.   Respiratory: Positive for shortness of breath. Negative for cough and chest tightness.   Cardiovascular: Negative for chest pain and leg swelling.  Gastrointestinal: Negative for abdominal pain, blood in stool, diarrhea, nausea and vomiting.  Genitourinary: Negative for difficulty urinating, flank  pain, frequency and hematuria.  Musculoskeletal: Negative for arthralgias and back pain.  Skin: Negative for rash.  Neurological: Negative for dizziness, speech difficulty, weakness, numbness and headaches.    Physical Exam Updated Vital Signs BP (!) 145/82 (BP Location: Left Arm)   Pulse 70   Temp 98.7 F (37.1 C) (Oral)   Resp 18   Ht 6\' 4"  (1.93 m)   Wt 136.1 kg   SpO2 98%   BMI 36.52 kg/m   Physical Exam Constitutional:      Appearance: He is well-developed.  HENT:     Head: Normocephalic and atraumatic.     Mouth/Throat:     Comments: Patient has  noted swelling to his uvula and posterior pharynx, he does have a hot potato voice, no stridor, no wheezing Eyes:     Pupils: Pupils are equal, round, and reactive to light.  Cardiovascular:     Rate and Rhythm: Normal rate and regular rhythm.     Heart sounds: Normal heart sounds.  Pulmonary:     Effort: Pulmonary effort is normal. No respiratory distress.     Breath sounds: Normal breath sounds. No wheezing or rales.  Chest:     Chest wall: No tenderness.  Abdominal:     General: Bowel sounds are normal.     Palpations: Abdomen is soft.     Tenderness: There is no abdominal tenderness. There is no guarding or rebound.  Musculoskeletal:        General: Normal range of motion.     Cervical back: Normal range of motion and neck supple.  Lymphadenopathy:     Cervical: No cervical adenopathy.  Skin:    General: Skin is warm and dry.     Findings: No rash.  Neurological:     Mental Status: He is alert and oriented to person, place, and time.     ED Results / Procedures / Treatments   Labs (all labs ordered are listed, but only abnormal results are displayed) Labs Reviewed - No data to display  EKG EKG Interpretation  Date/Time:  Wednesday January 30 2019 16:43:49 EST Ventricular Rate:  91 PR Interval:  124 QRS Duration: 116 QT Interval:  350 QTC Calculation: 430 R Axis:   -32 Text Interpretation: Normal sinus rhythm Wolff-Parkinson-White Abnormal ECG since last tracing no significant change Confirmed by Malvin Johns [phone]) on 01/30/2019 5:32:11 PM   Radiology No results found.  Procedures Procedures (including critical care time)  Medications Ordered in ED Medications  EPINEPHrine (EPI-PEN) 0.3 mg/0.3 mL injection (0.3 mg  Given 01/30/19 1626)  methylPREDNISolone sodium succinate (SOLU-MEDROL) 125 mg/2 mL injection (125 mg  Given 01/30/19 1631)  famotidine (PEPCID) 20-0.9 MG/50ML-% IVPB (  Stopped 01/30/19 1649)    ED Course  I have reviewed the triage  vital signs and the nursing notes.  Pertinent labs & imaging results that were available during my care of the patient were reviewed by me and considered in my medical decision making (see chart for details).    MDM Rules/Calculators/A&P                      16:50 pt with anaphylaxis, airway compromise, given epi, solumedrol, pepcid  17:10 feeling a little better, airway feels slightly less swollen per pt. 18:00 pt feeling much better, still with mild swelling  Patient is a 33 year old male who presents with an anaphylactic reaction with swelling to his uvula and posterior pharynx.  He was treated on arrival with epinephrine,  Solu-Medrol and Pepcid.  He had ongoing improvement following this.  He was monitored for about 5 hours with no worsening symptoms and he ultimately had complete resolution of his symptoms.  He was discharged home in good condition.  He was given a prescription for prednisone as well as an EpiPen.  I discussed with him following up with an allergist for testing.  Return precautions were given.  CRITICAL CARE Performed by: Rolan BuccoMelanie Aniella Wandrey Total critical care time: 60 minutes Critical care time was exclusive of separately billable procedures and treating other patients. Critical care was necessary to treat or prevent imminent or life-threatening deterioration. Critical care was time spent personally by me on the following activities: development of treatment plan with patient and/or surrogate as well as nursing, discussions with consultants, evaluation of patient's response to treatment, examination of patient, obtaining history from patient or surrogate, ordering and performing treatments and interventions, ordering and review of laboratory studies, ordering and review of radiographic studies, pulse oximetry and re-evaluation of patient's condition.  Final Clinical Impression(s) / ED Diagnoses Final diagnoses:  Anaphylaxis, initial encounter    Rx / DC Orders ED  Discharge Orders         Ordered    predniSONE (DELTASONE) 20 MG tablet     01/30/19 2135    EPINEPHrine 0.3 mg/0.3 mL IJ SOAJ injection  As needed     01/30/19 2135           Rolan BuccoBelfi, Cammy Sanjurjo, MD 01/30/19 2137

## 2019-01-30 NOTE — ED Notes (Signed)
Pt removed himself from the monitor and dressed; sts he feels fine and is ready to go.

## 2019-01-30 NOTE — ED Notes (Signed)
Pt sts he feel better; denies itching at this time.

## 2019-01-30 NOTE — Discharge Instructions (Signed)
You should follow-up with an allergist as discussed.  Take the prednisone as directed.  Return to the emergency room if you have any worsening symptoms.

## 2019-06-05 DIAGNOSIS — F329 Major depressive disorder, single episode, unspecified: Secondary | ICD-10-CM | POA: Diagnosis not present

## 2019-07-02 DIAGNOSIS — I459 Conduction disorder, unspecified: Secondary | ICD-10-CM | POA: Diagnosis not present

## 2019-07-02 DIAGNOSIS — I456 Pre-excitation syndrome: Secondary | ICD-10-CM | POA: Diagnosis not present

## 2019-07-02 DIAGNOSIS — I493 Ventricular premature depolarization: Secondary | ICD-10-CM | POA: Diagnosis not present

## 2019-07-02 DIAGNOSIS — R002 Palpitations: Secondary | ICD-10-CM | POA: Diagnosis not present

## 2019-07-02 DIAGNOSIS — Z87891 Personal history of nicotine dependence: Secondary | ICD-10-CM | POA: Diagnosis not present

## 2019-11-12 DIAGNOSIS — R197 Diarrhea, unspecified: Secondary | ICD-10-CM | POA: Diagnosis not present

## 2019-11-12 DIAGNOSIS — R0602 Shortness of breath: Secondary | ICD-10-CM | POA: Diagnosis not present

## 2019-11-13 DIAGNOSIS — F331 Major depressive disorder, recurrent, moderate: Secondary | ICD-10-CM | POA: Diagnosis not present

## 2019-11-13 DIAGNOSIS — Z7189 Other specified counseling: Secondary | ICD-10-CM | POA: Diagnosis not present

## 2019-11-13 DIAGNOSIS — Z6838 Body mass index (BMI) 38.0-38.9, adult: Secondary | ICD-10-CM | POA: Diagnosis not present

## 2019-11-13 DIAGNOSIS — F1120 Opioid dependence, uncomplicated: Secondary | ICD-10-CM | POA: Diagnosis not present

## 2019-11-13 DIAGNOSIS — Z79899 Other long term (current) drug therapy: Secondary | ICD-10-CM | POA: Diagnosis not present

## 2019-11-13 DIAGNOSIS — E6609 Other obesity due to excess calories: Secondary | ICD-10-CM | POA: Diagnosis not present

## 2019-12-11 DIAGNOSIS — Z6839 Body mass index (BMI) 39.0-39.9, adult: Secondary | ICD-10-CM | POA: Diagnosis not present

## 2019-12-11 DIAGNOSIS — F319 Bipolar disorder, unspecified: Secondary | ICD-10-CM | POA: Diagnosis not present

## 2019-12-11 DIAGNOSIS — F909 Attention-deficit hyperactivity disorder, unspecified type: Secondary | ICD-10-CM | POA: Diagnosis not present

## 2019-12-11 DIAGNOSIS — F1120 Opioid dependence, uncomplicated: Secondary | ICD-10-CM | POA: Diagnosis not present

## 2019-12-11 DIAGNOSIS — E6609 Other obesity due to excess calories: Secondary | ICD-10-CM | POA: Diagnosis not present

## 2020-01-10 DIAGNOSIS — F411 Generalized anxiety disorder: Secondary | ICD-10-CM | POA: Diagnosis not present

## 2020-01-10 DIAGNOSIS — E6609 Other obesity due to excess calories: Secondary | ICD-10-CM | POA: Diagnosis not present

## 2020-01-10 DIAGNOSIS — F909 Attention-deficit hyperactivity disorder, unspecified type: Secondary | ICD-10-CM | POA: Diagnosis not present

## 2020-01-10 DIAGNOSIS — Z6839 Body mass index (BMI) 39.0-39.9, adult: Secondary | ICD-10-CM | POA: Diagnosis not present

## 2020-01-10 DIAGNOSIS — F319 Bipolar disorder, unspecified: Secondary | ICD-10-CM | POA: Diagnosis not present

## 2020-02-14 DIAGNOSIS — F411 Generalized anxiety disorder: Secondary | ICD-10-CM | POA: Diagnosis not present

## 2020-02-14 DIAGNOSIS — F319 Bipolar disorder, unspecified: Secondary | ICD-10-CM | POA: Diagnosis not present

## 2020-02-14 DIAGNOSIS — Z0000 Encounter for general adult medical examination without abnormal findings: Secondary | ICD-10-CM | POA: Diagnosis not present

## 2020-02-14 DIAGNOSIS — F909 Attention-deficit hyperactivity disorder, unspecified type: Secondary | ICD-10-CM | POA: Diagnosis not present

## 2020-02-14 DIAGNOSIS — Z6841 Body Mass Index (BMI) 40.0 and over, adult: Secondary | ICD-10-CM | POA: Diagnosis not present

## 2020-05-19 DIAGNOSIS — S80862A Insect bite (nonvenomous), left lower leg, initial encounter: Secondary | ICD-10-CM | POA: Diagnosis not present

## 2020-05-20 DIAGNOSIS — Z87891 Personal history of nicotine dependence: Secondary | ICD-10-CM | POA: Diagnosis not present

## 2020-05-20 DIAGNOSIS — R2242 Localized swelling, mass and lump, left lower limb: Secondary | ICD-10-CM | POA: Diagnosis not present

## 2020-05-20 DIAGNOSIS — Z792 Long term (current) use of antibiotics: Secondary | ICD-10-CM | POA: Diagnosis not present

## 2020-05-20 DIAGNOSIS — L03116 Cellulitis of left lower limb: Secondary | ICD-10-CM | POA: Diagnosis not present

## 2020-05-20 DIAGNOSIS — S80862A Insect bite (nonvenomous), left lower leg, initial encounter: Secondary | ICD-10-CM | POA: Diagnosis not present

## 2020-05-20 DIAGNOSIS — R42 Dizziness and giddiness: Secondary | ICD-10-CM | POA: Diagnosis not present

## 2020-05-20 DIAGNOSIS — L02416 Cutaneous abscess of left lower limb: Secondary | ICD-10-CM | POA: Diagnosis not present

## 2020-05-22 DIAGNOSIS — S81811A Laceration without foreign body, right lower leg, initial encounter: Secondary | ICD-10-CM | POA: Diagnosis not present

## 2020-05-22 DIAGNOSIS — Z87891 Personal history of nicotine dependence: Secondary | ICD-10-CM | POA: Diagnosis not present

## 2020-05-31 DIAGNOSIS — R42 Dizziness and giddiness: Secondary | ICD-10-CM | POA: Diagnosis not present

## 2020-05-31 DIAGNOSIS — R531 Weakness: Secondary | ICD-10-CM | POA: Diagnosis not present

## 2020-05-31 DIAGNOSIS — R002 Palpitations: Secondary | ICD-10-CM | POA: Diagnosis not present

## 2020-05-31 DIAGNOSIS — R0789 Other chest pain: Secondary | ICD-10-CM | POA: Diagnosis not present

## 2020-05-31 DIAGNOSIS — R9431 Abnormal electrocardiogram [ECG] [EKG]: Secondary | ICD-10-CM | POA: Diagnosis not present

## 2020-06-10 DIAGNOSIS — F909 Attention-deficit hyperactivity disorder, unspecified type: Secondary | ICD-10-CM | POA: Diagnosis not present

## 2020-06-10 DIAGNOSIS — Z6841 Body Mass Index (BMI) 40.0 and over, adult: Secondary | ICD-10-CM | POA: Diagnosis not present

## 2020-06-10 DIAGNOSIS — F319 Bipolar disorder, unspecified: Secondary | ICD-10-CM | POA: Diagnosis not present

## 2020-06-10 DIAGNOSIS — E6609 Other obesity due to excess calories: Secondary | ICD-10-CM | POA: Diagnosis not present

## 2020-06-10 DIAGNOSIS — F411 Generalized anxiety disorder: Secondary | ICD-10-CM | POA: Diagnosis not present

## 2020-06-16 DIAGNOSIS — F1120 Opioid dependence, uncomplicated: Secondary | ICD-10-CM | POA: Diagnosis not present

## 2020-06-16 DIAGNOSIS — F411 Generalized anxiety disorder: Secondary | ICD-10-CM | POA: Diagnosis not present

## 2020-06-16 DIAGNOSIS — F322 Major depressive disorder, single episode, severe without psychotic features: Secondary | ICD-10-CM | POA: Diagnosis not present

## 2020-08-05 DIAGNOSIS — F1120 Opioid dependence, uncomplicated: Secondary | ICD-10-CM | POA: Diagnosis not present

## 2020-08-05 DIAGNOSIS — E6609 Other obesity due to excess calories: Secondary | ICD-10-CM | POA: Diagnosis not present

## 2020-08-05 DIAGNOSIS — R5383 Other fatigue: Secondary | ICD-10-CM | POA: Diagnosis not present

## 2020-08-05 DIAGNOSIS — Z6841 Body Mass Index (BMI) 40.0 and over, adult: Secondary | ICD-10-CM | POA: Diagnosis not present

## 2020-08-05 DIAGNOSIS — F411 Generalized anxiety disorder: Secondary | ICD-10-CM | POA: Diagnosis not present

## 2020-08-14 DIAGNOSIS — F411 Generalized anxiety disorder: Secondary | ICD-10-CM | POA: Diagnosis not present

## 2020-08-14 DIAGNOSIS — E281 Androgen excess: Secondary | ICD-10-CM | POA: Diagnosis not present

## 2020-08-14 DIAGNOSIS — F319 Bipolar disorder, unspecified: Secondary | ICD-10-CM | POA: Diagnosis not present

## 2020-08-14 DIAGNOSIS — Z6841 Body Mass Index (BMI) 40.0 and over, adult: Secondary | ICD-10-CM | POA: Diagnosis not present

## 2020-08-14 DIAGNOSIS — F909 Attention-deficit hyperactivity disorder, unspecified type: Secondary | ICD-10-CM | POA: Diagnosis not present

## 2020-10-28 DIAGNOSIS — Z91018 Allergy to other foods: Secondary | ICD-10-CM | POA: Diagnosis not present

## 2020-10-28 DIAGNOSIS — Z87892 Personal history of anaphylaxis: Secondary | ICD-10-CM | POA: Diagnosis not present

## 2020-10-28 DIAGNOSIS — J302 Other seasonal allergic rhinitis: Secondary | ICD-10-CM | POA: Diagnosis not present

## 2020-10-28 DIAGNOSIS — J4540 Moderate persistent asthma, uncomplicated: Secondary | ICD-10-CM | POA: Diagnosis not present

## 2020-11-16 DIAGNOSIS — I10 Essential (primary) hypertension: Secondary | ICD-10-CM | POA: Diagnosis not present

## 2020-11-16 DIAGNOSIS — Z131 Encounter for screening for diabetes mellitus: Secondary | ICD-10-CM | POA: Diagnosis not present

## 2020-11-16 DIAGNOSIS — K5909 Other constipation: Secondary | ICD-10-CM | POA: Diagnosis not present

## 2020-11-16 DIAGNOSIS — F410 Panic disorder [episodic paroxysmal anxiety] without agoraphobia: Secondary | ICD-10-CM | POA: Diagnosis not present

## 2020-11-16 DIAGNOSIS — F411 Generalized anxiety disorder: Secondary | ICD-10-CM | POA: Diagnosis not present

## 2020-11-16 DIAGNOSIS — Z13220 Encounter for screening for lipoid disorders: Secondary | ICD-10-CM | POA: Diagnosis not present

## 2020-12-09 DIAGNOSIS — R0683 Snoring: Secondary | ICD-10-CM | POA: Diagnosis not present

## 2020-12-09 DIAGNOSIS — M5136 Other intervertebral disc degeneration, lumbar region: Secondary | ICD-10-CM | POA: Diagnosis not present

## 2020-12-09 DIAGNOSIS — I10 Essential (primary) hypertension: Secondary | ICD-10-CM | POA: Diagnosis not present

## 2020-12-10 DIAGNOSIS — R0602 Shortness of breath: Secondary | ICD-10-CM | POA: Diagnosis not present

## 2020-12-10 DIAGNOSIS — G4733 Obstructive sleep apnea (adult) (pediatric): Secondary | ICD-10-CM | POA: Diagnosis not present

## 2020-12-11 DIAGNOSIS — R0602 Shortness of breath: Secondary | ICD-10-CM | POA: Diagnosis not present

## 2020-12-11 DIAGNOSIS — G4733 Obstructive sleep apnea (adult) (pediatric): Secondary | ICD-10-CM | POA: Diagnosis not present

## 2020-12-26 DIAGNOSIS — M5441 Lumbago with sciatica, right side: Secondary | ICD-10-CM | POA: Diagnosis not present

## 2020-12-26 DIAGNOSIS — G8911 Acute pain due to trauma: Secondary | ICD-10-CM | POA: Diagnosis not present

## 2020-12-26 DIAGNOSIS — M545 Low back pain, unspecified: Secondary | ICD-10-CM | POA: Diagnosis not present

## 2020-12-26 DIAGNOSIS — M5136 Other intervertebral disc degeneration, lumbar region: Secondary | ICD-10-CM | POA: Diagnosis not present

## 2020-12-26 DIAGNOSIS — M5431 Sciatica, right side: Secondary | ICD-10-CM | POA: Diagnosis not present

## 2020-12-26 DIAGNOSIS — M5186 Other intervertebral disc disorders, lumbar region: Secondary | ICD-10-CM | POA: Diagnosis not present

## 2020-12-26 DIAGNOSIS — F17220 Nicotine dependence, chewing tobacco, uncomplicated: Secondary | ICD-10-CM | POA: Diagnosis not present

## 2020-12-26 DIAGNOSIS — M25571 Pain in right ankle and joints of right foot: Secondary | ICD-10-CM | POA: Diagnosis not present

## 2020-12-26 DIAGNOSIS — M48061 Spinal stenosis, lumbar region without neurogenic claudication: Secondary | ICD-10-CM | POA: Diagnosis not present

## 2020-12-26 DIAGNOSIS — M5137 Other intervertebral disc degeneration, lumbosacral region: Secondary | ICD-10-CM | POA: Diagnosis not present

## 2020-12-29 DIAGNOSIS — M5431 Sciatica, right side: Secondary | ICD-10-CM | POA: Diagnosis not present

## 2020-12-29 DIAGNOSIS — M4726 Other spondylosis with radiculopathy, lumbar region: Secondary | ICD-10-CM | POA: Diagnosis not present

## 2020-12-29 DIAGNOSIS — M5116 Intervertebral disc disorders with radiculopathy, lumbar region: Secondary | ICD-10-CM | POA: Diagnosis not present

## 2020-12-29 DIAGNOSIS — M549 Dorsalgia, unspecified: Secondary | ICD-10-CM | POA: Diagnosis not present

## 2021-01-01 DIAGNOSIS — G4733 Obstructive sleep apnea (adult) (pediatric): Secondary | ICD-10-CM | POA: Diagnosis not present

## 2021-01-19 DIAGNOSIS — M48061 Spinal stenosis, lumbar region without neurogenic claudication: Secondary | ICD-10-CM | POA: Diagnosis not present

## 2021-01-19 DIAGNOSIS — M2578 Osteophyte, vertebrae: Secondary | ICD-10-CM | POA: Diagnosis not present

## 2021-01-19 DIAGNOSIS — M5127 Other intervertebral disc displacement, lumbosacral region: Secondary | ICD-10-CM | POA: Diagnosis not present

## 2021-01-19 DIAGNOSIS — M5116 Intervertebral disc disorders with radiculopathy, lumbar region: Secondary | ICD-10-CM | POA: Diagnosis not present

## 2021-01-19 DIAGNOSIS — M5137 Other intervertebral disc degeneration, lumbosacral region: Secondary | ICD-10-CM | POA: Diagnosis not present

## 2021-01-19 DIAGNOSIS — M5416 Radiculopathy, lumbar region: Secondary | ICD-10-CM | POA: Diagnosis not present

## 2021-02-01 DIAGNOSIS — G4733 Obstructive sleep apnea (adult) (pediatric): Secondary | ICD-10-CM | POA: Diagnosis not present

## 2021-02-04 DIAGNOSIS — M5116 Intervertebral disc disorders with radiculopathy, lumbar region: Secondary | ICD-10-CM | POA: Diagnosis not present

## 2021-02-04 DIAGNOSIS — M4726 Other spondylosis with radiculopathy, lumbar region: Secondary | ICD-10-CM | POA: Diagnosis not present

## 2021-02-04 DIAGNOSIS — M5431 Sciatica, right side: Secondary | ICD-10-CM | POA: Diagnosis not present

## 2021-02-05 DIAGNOSIS — M5116 Intervertebral disc disorders with radiculopathy, lumbar region: Secondary | ICD-10-CM | POA: Diagnosis not present

## 2021-02-23 DIAGNOSIS — Z6838 Body mass index (BMI) 38.0-38.9, adult: Secondary | ICD-10-CM | POA: Diagnosis not present

## 2021-02-23 DIAGNOSIS — N529 Male erectile dysfunction, unspecified: Secondary | ICD-10-CM | POA: Diagnosis not present

## 2021-03-01 DIAGNOSIS — M5116 Intervertebral disc disorders with radiculopathy, lumbar region: Secondary | ICD-10-CM | POA: Diagnosis not present

## 2021-03-04 DIAGNOSIS — G4733 Obstructive sleep apnea (adult) (pediatric): Secondary | ICD-10-CM | POA: Diagnosis not present

## 2021-03-16 DIAGNOSIS — R1011 Right upper quadrant pain: Secondary | ICD-10-CM | POA: Diagnosis not present

## 2021-03-16 DIAGNOSIS — I10 Essential (primary) hypertension: Secondary | ICD-10-CM | POA: Diagnosis not present

## 2021-03-16 DIAGNOSIS — R110 Nausea: Secondary | ICD-10-CM | POA: Diagnosis not present

## 2021-03-16 DIAGNOSIS — R162 Hepatomegaly with splenomegaly, not elsewhere classified: Secondary | ICD-10-CM | POA: Diagnosis not present

## 2021-03-16 DIAGNOSIS — J45909 Unspecified asthma, uncomplicated: Secondary | ICD-10-CM | POA: Diagnosis not present

## 2021-03-16 DIAGNOSIS — Z79899 Other long term (current) drug therapy: Secondary | ICD-10-CM | POA: Diagnosis not present

## 2021-03-16 DIAGNOSIS — K3580 Unspecified acute appendicitis: Secondary | ICD-10-CM | POA: Diagnosis not present

## 2021-03-16 DIAGNOSIS — K37 Unspecified appendicitis: Secondary | ICD-10-CM | POA: Diagnosis not present

## 2021-03-16 DIAGNOSIS — R109 Unspecified abdominal pain: Secondary | ICD-10-CM | POA: Diagnosis not present

## 2021-03-16 DIAGNOSIS — Z87891 Personal history of nicotine dependence: Secondary | ICD-10-CM | POA: Diagnosis not present

## 2021-03-16 DIAGNOSIS — K5730 Diverticulosis of large intestine without perforation or abscess without bleeding: Secondary | ICD-10-CM | POA: Diagnosis not present

## 2021-03-16 DIAGNOSIS — K429 Umbilical hernia without obstruction or gangrene: Secondary | ICD-10-CM | POA: Diagnosis not present

## 2021-03-16 DIAGNOSIS — D3A8 Other benign neuroendocrine tumors: Secondary | ICD-10-CM | POA: Diagnosis not present

## 2021-03-16 DIAGNOSIS — G4730 Sleep apnea, unspecified: Secondary | ICD-10-CM | POA: Diagnosis not present

## 2021-03-16 DIAGNOSIS — K769 Liver disease, unspecified: Secondary | ICD-10-CM | POA: Diagnosis not present

## 2021-03-16 DIAGNOSIS — K420 Umbilical hernia with obstruction, without gangrene: Secondary | ICD-10-CM | POA: Diagnosis not present

## 2021-03-16 DIAGNOSIS — C181 Malignant neoplasm of appendix: Secondary | ICD-10-CM | POA: Diagnosis not present

## 2021-03-31 DIAGNOSIS — K353 Acute appendicitis with localized peritonitis, without perforation or gangrene: Secondary | ICD-10-CM | POA: Diagnosis not present

## 2021-03-31 DIAGNOSIS — C7A8 Other malignant neuroendocrine tumors: Secondary | ICD-10-CM | POA: Diagnosis not present

## 2021-03-31 DIAGNOSIS — D3A8 Other benign neuroendocrine tumors: Secondary | ICD-10-CM | POA: Diagnosis not present

## 2021-04-12 DIAGNOSIS — F410 Panic disorder [episodic paroxysmal anxiety] without agoraphobia: Secondary | ICD-10-CM | POA: Diagnosis not present

## 2021-04-12 DIAGNOSIS — F411 Generalized anxiety disorder: Secondary | ICD-10-CM | POA: Diagnosis not present

## 2021-04-12 DIAGNOSIS — F5104 Psychophysiologic insomnia: Secondary | ICD-10-CM | POA: Diagnosis not present

## 2021-04-12 DIAGNOSIS — N529 Male erectile dysfunction, unspecified: Secondary | ICD-10-CM | POA: Diagnosis not present

## 2021-04-15 DIAGNOSIS — C7A020 Malignant carcinoid tumor of the appendix: Secondary | ICD-10-CM | POA: Diagnosis not present

## 2021-04-15 DIAGNOSIS — C7A8 Other malignant neuroendocrine tumors: Secondary | ICD-10-CM | POA: Diagnosis not present

## 2021-04-27 DIAGNOSIS — D3A020 Benign carcinoid tumor of the appendix: Secondary | ICD-10-CM | POA: Diagnosis not present

## 2021-06-29 DIAGNOSIS — G4730 Sleep apnea, unspecified: Secondary | ICD-10-CM | POA: Diagnosis not present

## 2021-06-29 DIAGNOSIS — I10 Essential (primary) hypertension: Secondary | ICD-10-CM | POA: Diagnosis not present

## 2021-06-29 DIAGNOSIS — Z01810 Encounter for preprocedural cardiovascular examination: Secondary | ICD-10-CM | POA: Diagnosis not present

## 2021-06-29 DIAGNOSIS — Z87891 Personal history of nicotine dependence: Secondary | ICD-10-CM | POA: Diagnosis not present

## 2021-06-29 DIAGNOSIS — C7A020 Malignant carcinoid tumor of the appendix: Secondary | ICD-10-CM | POA: Diagnosis not present

## 2021-07-08 DIAGNOSIS — Z9089 Acquired absence of other organs: Secondary | ICD-10-CM | POA: Diagnosis not present

## 2021-07-08 DIAGNOSIS — Z8679 Personal history of other diseases of the circulatory system: Secondary | ICD-10-CM | POA: Diagnosis not present

## 2021-07-08 DIAGNOSIS — C181 Malignant neoplasm of appendix: Secondary | ICD-10-CM | POA: Diagnosis not present

## 2021-07-08 DIAGNOSIS — E669 Obesity, unspecified: Secondary | ICD-10-CM | POA: Diagnosis not present

## 2021-07-08 DIAGNOSIS — G8929 Other chronic pain: Secondary | ICD-10-CM | POA: Diagnosis not present

## 2021-07-08 DIAGNOSIS — D72829 Elevated white blood cell count, unspecified: Secondary | ICD-10-CM | POA: Diagnosis not present

## 2021-07-08 DIAGNOSIS — F1123 Opioid dependence with withdrawal: Secondary | ICD-10-CM | POA: Diagnosis not present

## 2021-07-08 DIAGNOSIS — Z79899 Other long term (current) drug therapy: Secondary | ICD-10-CM | POA: Diagnosis not present

## 2021-07-08 DIAGNOSIS — M5020 Other cervical disc displacement, unspecified cervical region: Secondary | ICD-10-CM | POA: Diagnosis not present

## 2021-07-08 DIAGNOSIS — G4733 Obstructive sleep apnea (adult) (pediatric): Secondary | ICD-10-CM | POA: Diagnosis not present

## 2021-07-08 DIAGNOSIS — Z6839 Body mass index (BMI) 39.0-39.9, adult: Secondary | ICD-10-CM | POA: Diagnosis not present

## 2021-07-08 DIAGNOSIS — G8918 Other acute postprocedural pain: Secondary | ICD-10-CM | POA: Diagnosis not present

## 2021-07-08 DIAGNOSIS — C180 Malignant neoplasm of cecum: Secondary | ICD-10-CM | POA: Diagnosis not present

## 2021-07-08 DIAGNOSIS — I10 Essential (primary) hypertension: Secondary | ICD-10-CM | POA: Diagnosis not present

## 2021-07-08 DIAGNOSIS — Z87891 Personal history of nicotine dependence: Secondary | ICD-10-CM | POA: Diagnosis not present

## 2021-07-08 DIAGNOSIS — R001 Bradycardia, unspecified: Secondary | ICD-10-CM | POA: Diagnosis not present

## 2021-07-08 DIAGNOSIS — K660 Peritoneal adhesions (postprocedural) (postinfection): Secondary | ICD-10-CM | POA: Diagnosis not present

## 2021-07-08 DIAGNOSIS — D3A020 Benign carcinoid tumor of the appendix: Secondary | ICD-10-CM | POA: Diagnosis not present

## 2021-07-08 DIAGNOSIS — J4520 Mild intermittent asthma, uncomplicated: Secondary | ICD-10-CM | POA: Diagnosis not present

## 2021-09-13 DIAGNOSIS — E782 Mixed hyperlipidemia: Secondary | ICD-10-CM | POA: Diagnosis not present

## 2021-09-13 DIAGNOSIS — I10 Essential (primary) hypertension: Secondary | ICD-10-CM | POA: Diagnosis not present

## 2021-09-13 DIAGNOSIS — Z859 Personal history of malignant neoplasm, unspecified: Secondary | ICD-10-CM | POA: Diagnosis not present

## 2021-09-13 DIAGNOSIS — G4733 Obstructive sleep apnea (adult) (pediatric): Secondary | ICD-10-CM | POA: Diagnosis not present

## 2022-02-21 DIAGNOSIS — T485X5A Adverse effect of other anti-common-cold drugs, initial encounter: Secondary | ICD-10-CM | POA: Diagnosis not present

## 2022-02-21 DIAGNOSIS — J342 Deviated nasal septum: Secondary | ICD-10-CM | POA: Diagnosis not present

## 2022-02-21 DIAGNOSIS — J310 Chronic rhinitis: Secondary | ICD-10-CM | POA: Diagnosis not present

## 2022-03-14 DIAGNOSIS — Z7689 Persons encountering health services in other specified circumstances: Secondary | ICD-10-CM | POA: Diagnosis not present

## 2022-03-14 DIAGNOSIS — Z136 Encounter for screening for cardiovascular disorders: Secondary | ICD-10-CM | POA: Diagnosis not present

## 2022-03-14 DIAGNOSIS — Z13220 Encounter for screening for lipoid disorders: Secondary | ICD-10-CM | POA: Diagnosis not present

## 2022-03-14 DIAGNOSIS — I10 Essential (primary) hypertension: Secondary | ICD-10-CM | POA: Diagnosis not present

## 2022-03-14 DIAGNOSIS — C7A8 Other malignant neuroendocrine tumors: Secondary | ICD-10-CM | POA: Diagnosis not present

## 2022-03-14 DIAGNOSIS — I456 Pre-excitation syndrome: Secondary | ICD-10-CM | POA: Diagnosis not present

## 2022-05-04 DIAGNOSIS — R079 Chest pain, unspecified: Secondary | ICD-10-CM | POA: Diagnosis not present

## 2022-05-04 DIAGNOSIS — R42 Dizziness and giddiness: Secondary | ICD-10-CM | POA: Diagnosis not present

## 2022-05-04 DIAGNOSIS — Z87891 Personal history of nicotine dependence: Secondary | ICD-10-CM | POA: Diagnosis not present

## 2022-05-04 DIAGNOSIS — R55 Syncope and collapse: Secondary | ICD-10-CM | POA: Diagnosis not present

## 2022-05-04 DIAGNOSIS — R0789 Other chest pain: Secondary | ICD-10-CM | POA: Diagnosis not present

## 2022-06-08 DIAGNOSIS — I10 Essential (primary) hypertension: Secondary | ICD-10-CM | POA: Diagnosis not present

## 2022-06-08 DIAGNOSIS — R002 Palpitations: Secondary | ICD-10-CM | POA: Diagnosis not present

## 2022-06-08 DIAGNOSIS — I456 Pre-excitation syndrome: Secondary | ICD-10-CM | POA: Diagnosis not present

## 2022-06-08 DIAGNOSIS — R55 Syncope and collapse: Secondary | ICD-10-CM | POA: Diagnosis not present

## 2022-07-11 DIAGNOSIS — R002 Palpitations: Secondary | ICD-10-CM | POA: Diagnosis not present

## 2022-10-10 DIAGNOSIS — M5116 Intervertebral disc disorders with radiculopathy, lumbar region: Secondary | ICD-10-CM | POA: Diagnosis not present

## 2022-10-10 DIAGNOSIS — Z6839 Body mass index (BMI) 39.0-39.9, adult: Secondary | ICD-10-CM | POA: Diagnosis not present

## 2022-10-10 DIAGNOSIS — M545 Low back pain, unspecified: Secondary | ICD-10-CM | POA: Diagnosis not present

## 2022-10-10 DIAGNOSIS — M47894 Other spondylosis, thoracic region: Secondary | ICD-10-CM | POA: Diagnosis not present

## 2022-10-18 ENCOUNTER — Other Ambulatory Visit: Payer: Self-pay | Admitting: Orthopedic Surgery

## 2022-10-18 DIAGNOSIS — M546 Pain in thoracic spine: Secondary | ICD-10-CM

## 2022-11-14 ENCOUNTER — Encounter: Payer: Self-pay | Admitting: Orthopedic Surgery

## 2022-11-23 ENCOUNTER — Ambulatory Visit
Admission: RE | Admit: 2022-11-23 | Discharge: 2022-11-23 | Disposition: A | Discharge status: Home / Self Care | Payer: BC Managed Care – PPO | Source: Ambulatory Visit | Attending: Orthopedic Surgery | Admitting: Orthopedic Surgery

## 2022-11-23 DIAGNOSIS — M546 Pain in thoracic spine: Secondary | ICD-10-CM

## 2022-12-05 DIAGNOSIS — R0789 Other chest pain: Secondary | ICD-10-CM | POA: Diagnosis not present

## 2022-12-05 DIAGNOSIS — R079 Chest pain, unspecified: Secondary | ICD-10-CM | POA: Diagnosis not present

## 2022-12-05 DIAGNOSIS — Z8679 Personal history of other diseases of the circulatory system: Secondary | ICD-10-CM | POA: Diagnosis not present

## 2022-12-05 DIAGNOSIS — R42 Dizziness and giddiness: Secondary | ICD-10-CM | POA: Diagnosis not present

## 2022-12-05 DIAGNOSIS — I498 Other specified cardiac arrhythmias: Secondary | ICD-10-CM | POA: Diagnosis not present

## 2022-12-05 DIAGNOSIS — R002 Palpitations: Secondary | ICD-10-CM | POA: Diagnosis not present

## 2022-12-05 DIAGNOSIS — F17290 Nicotine dependence, other tobacco product, uncomplicated: Secondary | ICD-10-CM | POA: Diagnosis not present

## 2022-12-05 DIAGNOSIS — R0602 Shortness of breath: Secondary | ICD-10-CM | POA: Diagnosis not present

## 2022-12-05 DIAGNOSIS — J8489 Other specified interstitial pulmonary diseases: Secondary | ICD-10-CM | POA: Diagnosis not present

## 2022-12-09 DIAGNOSIS — M546 Pain in thoracic spine: Secondary | ICD-10-CM | POA: Diagnosis not present

## 2022-12-09 DIAGNOSIS — M5116 Intervertebral disc disorders with radiculopathy, lumbar region: Secondary | ICD-10-CM | POA: Diagnosis not present

## 2022-12-09 DIAGNOSIS — M47894 Other spondylosis, thoracic region: Secondary | ICD-10-CM | POA: Diagnosis not present

## 2022-12-09 DIAGNOSIS — M545 Low back pain, unspecified: Secondary | ICD-10-CM | POA: Diagnosis not present

## 2023-01-03 DIAGNOSIS — R002 Palpitations: Secondary | ICD-10-CM | POA: Diagnosis not present

## 2023-01-19 DIAGNOSIS — R55 Syncope and collapse: Secondary | ICD-10-CM | POA: Diagnosis not present

## 2023-01-19 DIAGNOSIS — I10 Essential (primary) hypertension: Secondary | ICD-10-CM | POA: Diagnosis not present

## 2023-01-19 DIAGNOSIS — R002 Palpitations: Secondary | ICD-10-CM | POA: Diagnosis not present
